# Patient Record
Sex: Male | Born: 1935 | Race: White | Hispanic: No | Marital: Married | Smoking: Former smoker
Health system: Southern US, Community
[De-identification: ages and names within clinical notes are randomized; demographics above are authoritative.]

## PROBLEM LIST (undated history)

## (undated) DIAGNOSIS — I10 Essential (primary) hypertension: Secondary | ICD-10-CM

## (undated) DIAGNOSIS — C61 Malignant neoplasm of prostate: Secondary | ICD-10-CM

## (undated) DIAGNOSIS — I509 Heart failure, unspecified: Secondary | ICD-10-CM

## (undated) DIAGNOSIS — C4490 Unspecified malignant neoplasm of skin, unspecified: Secondary | ICD-10-CM

## (undated) DIAGNOSIS — E119 Type 2 diabetes mellitus without complications: Secondary | ICD-10-CM

## (undated) HISTORY — DX: Malignant neoplasm of prostate: C61

## (undated) HISTORY — PX: PROSTATECTOMY: SHX69

## (undated) HISTORY — DX: Unspecified malignant neoplasm of skin, unspecified: C44.90

---

## 2014-12-07 DIAGNOSIS — Z95810 Presence of automatic (implantable) cardiac defibrillator: Secondary | ICD-10-CM | POA: Insufficient documentation

## 2014-12-07 DIAGNOSIS — I420 Dilated cardiomyopathy: Secondary | ICD-10-CM | POA: Insufficient documentation

## 2014-12-07 DIAGNOSIS — E782 Mixed hyperlipidemia: Secondary | ICD-10-CM | POA: Insufficient documentation

## 2014-12-13 DIAGNOSIS — E119 Type 2 diabetes mellitus without complications: Secondary | ICD-10-CM | POA: Insufficient documentation

## 2014-12-13 DIAGNOSIS — H3530 Unspecified macular degeneration: Secondary | ICD-10-CM | POA: Insufficient documentation

## 2015-04-12 ENCOUNTER — Other Ambulatory Visit: Payer: Self-pay | Admitting: Internal Medicine

## 2015-04-12 DIAGNOSIS — N183 Chronic kidney disease, stage 3 unspecified: Secondary | ICD-10-CM

## 2015-04-12 DIAGNOSIS — E1122 Type 2 diabetes mellitus with diabetic chronic kidney disease: Secondary | ICD-10-CM

## 2015-04-12 DIAGNOSIS — D6489 Other specified anemias: Secondary | ICD-10-CM

## 2015-04-18 ENCOUNTER — Ambulatory Visit
Admission: RE | Admit: 2015-04-18 | Discharge: 2015-04-18 | Disposition: A | Discharge status: Home / Self Care | Payer: Medicare Other | Source: Ambulatory Visit | Attending: Internal Medicine | Admitting: Internal Medicine

## 2015-04-18 DIAGNOSIS — E1122 Type 2 diabetes mellitus with diabetic chronic kidney disease: Secondary | ICD-10-CM | POA: Diagnosis present

## 2015-04-18 DIAGNOSIS — N183 Chronic kidney disease, stage 3 unspecified: Secondary | ICD-10-CM

## 2015-04-18 DIAGNOSIS — K8020 Calculus of gallbladder without cholecystitis without obstruction: Secondary | ICD-10-CM | POA: Diagnosis not present

## 2015-04-18 DIAGNOSIS — D6489 Other specified anemias: Secondary | ICD-10-CM | POA: Diagnosis present

## 2015-04-29 DIAGNOSIS — I10 Essential (primary) hypertension: Secondary | ICD-10-CM | POA: Insufficient documentation

## 2015-04-29 DIAGNOSIS — N183 Chronic kidney disease, stage 3 unspecified: Secondary | ICD-10-CM | POA: Insufficient documentation

## 2015-04-29 DIAGNOSIS — I700 Atherosclerosis of aorta: Secondary | ICD-10-CM | POA: Insufficient documentation

## 2016-10-15 ENCOUNTER — Ambulatory Visit
Admission: RE | Admit: 2016-10-15 | Discharge: 2016-10-15 | Disposition: A | Discharge status: Home / Self Care | Payer: Medicare Other | Source: Ambulatory Visit | Attending: Cardiology | Admitting: Cardiology

## 2016-10-15 ENCOUNTER — Other Ambulatory Visit: Payer: Self-pay | Admitting: Cardiology

## 2016-10-15 DIAGNOSIS — G3281 Cerebellar ataxia in diseases classified elsewhere: Secondary | ICD-10-CM | POA: Diagnosis present

## 2016-10-15 DIAGNOSIS — I639 Cerebral infarction, unspecified: Secondary | ICD-10-CM | POA: Diagnosis not present

## 2016-10-15 DIAGNOSIS — G319 Degenerative disease of nervous system, unspecified: Secondary | ICD-10-CM | POA: Diagnosis not present

## 2016-10-15 DIAGNOSIS — R9082 White matter disease, unspecified: Secondary | ICD-10-CM | POA: Diagnosis not present

## 2016-10-15 DIAGNOSIS — G9389 Other specified disorders of brain: Secondary | ICD-10-CM | POA: Diagnosis not present

## 2016-12-07 ENCOUNTER — Encounter: Payer: Self-pay | Admitting: Intensive Care

## 2016-12-07 ENCOUNTER — Emergency Department: Payer: Medicare Other

## 2016-12-07 ENCOUNTER — Inpatient Hospital Stay
Admission: EM | Admit: 2016-12-07 | Discharge: 2016-12-11 | DRG: 682 | Disposition: A | Discharge status: Skilled Nursing Facility | Payer: Medicare Other | Attending: Internal Medicine | Admitting: Internal Medicine

## 2016-12-07 DIAGNOSIS — Z7982 Long term (current) use of aspirin: Secondary | ICD-10-CM | POA: Diagnosis not present

## 2016-12-07 DIAGNOSIS — D631 Anemia in chronic kidney disease: Secondary | ICD-10-CM | POA: Diagnosis present

## 2016-12-07 DIAGNOSIS — Z95810 Presence of automatic (implantable) cardiac defibrillator: Secondary | ICD-10-CM | POA: Diagnosis not present

## 2016-12-07 DIAGNOSIS — Z87891 Personal history of nicotine dependence: Secondary | ICD-10-CM

## 2016-12-07 DIAGNOSIS — Z23 Encounter for immunization: Secondary | ICD-10-CM

## 2016-12-07 DIAGNOSIS — N179 Acute kidney failure, unspecified: Principal | ICD-10-CM

## 2016-12-07 DIAGNOSIS — E43 Unspecified severe protein-calorie malnutrition: Secondary | ICD-10-CM | POA: Diagnosis present

## 2016-12-07 DIAGNOSIS — Z79899 Other long term (current) drug therapy: Secondary | ICD-10-CM

## 2016-12-07 DIAGNOSIS — R1012 Left upper quadrant pain: Secondary | ICD-10-CM | POA: Diagnosis present

## 2016-12-07 DIAGNOSIS — Z8546 Personal history of malignant neoplasm of prostate: Secondary | ICD-10-CM

## 2016-12-07 DIAGNOSIS — Z7984 Long term (current) use of oral hypoglycemic drugs: Secondary | ICD-10-CM

## 2016-12-07 DIAGNOSIS — Z681 Body mass index (BMI) 19 or less, adult: Secondary | ICD-10-CM

## 2016-12-07 DIAGNOSIS — I130 Hypertensive heart and chronic kidney disease with heart failure and stage 1 through stage 4 chronic kidney disease, or unspecified chronic kidney disease: Secondary | ICD-10-CM | POA: Diagnosis present

## 2016-12-07 DIAGNOSIS — I509 Heart failure, unspecified: Secondary | ICD-10-CM | POA: Diagnosis present

## 2016-12-07 DIAGNOSIS — E860 Dehydration: Secondary | ICD-10-CM | POA: Diagnosis present

## 2016-12-07 DIAGNOSIS — E1122 Type 2 diabetes mellitus with diabetic chronic kidney disease: Secondary | ICD-10-CM | POA: Diagnosis present

## 2016-12-07 DIAGNOSIS — N183 Chronic kidney disease, stage 3 (moderate): Secondary | ICD-10-CM | POA: Diagnosis present

## 2016-12-07 HISTORY — DX: Type 2 diabetes mellitus without complications: E11.9

## 2016-12-07 HISTORY — DX: Essential (primary) hypertension: I10

## 2016-12-07 HISTORY — DX: Heart failure, unspecified: I50.9

## 2016-12-07 LAB — HEPATIC FUNCTION PANEL
ALBUMIN: 3.4 g/dL — AB (ref 3.5–5.0)
ALK PHOS: 23 U/L — AB (ref 38–126)
ALT: 21 U/L (ref 17–63)
AST: 34 U/L (ref 15–41)
BILIRUBIN INDIRECT: 0.7 mg/dL (ref 0.3–0.9)
BILIRUBIN TOTAL: 0.8 mg/dL (ref 0.3–1.2)
Bilirubin, Direct: 0.1 mg/dL (ref 0.1–0.5)
TOTAL PROTEIN: 6.8 g/dL (ref 6.5–8.1)

## 2016-12-07 LAB — BASIC METABOLIC PANEL
Anion gap: 12 (ref 5–15)
BUN: 68 mg/dL — AB (ref 6–20)
CALCIUM: 8.6 mg/dL — AB (ref 8.9–10.3)
CO2: 21 mmol/L — AB (ref 22–32)
Chloride: 100 mmol/L — ABNORMAL LOW (ref 101–111)
Creatinine, Ser: 3.35 mg/dL — ABNORMAL HIGH (ref 0.61–1.24)
GFR calc Af Amer: 18 mL/min — ABNORMAL LOW (ref >60)
GFR, EST NON AFRICAN AMERICAN: 15 mL/min — AB (ref >60)
GLUCOSE: 105 mg/dL — AB (ref 65–99)
Potassium: 4.3 mmol/L (ref 3.5–5.1)
Sodium: 133 mmol/L — ABNORMAL LOW (ref 135–145)

## 2016-12-07 LAB — CBC
HCT: 24.6 % — ABNORMAL LOW (ref 40.0–52.0)
Hemoglobin: 8.3 g/dL — ABNORMAL LOW (ref 13.0–18.0)
MCH: 29.5 pg (ref 26.0–34.0)
MCHC: 33.9 g/dL (ref 32.0–36.0)
MCV: 86.9 fL (ref 80.0–100.0)
Platelets: 507 10*3/uL — ABNORMAL HIGH (ref 150–440)
RBC: 2.83 MIL/uL — ABNORMAL LOW (ref 4.40–5.90)
RDW: 19.5 % — AB (ref 11.5–14.5)
WBC: 7.4 10*3/uL (ref 3.8–10.6)

## 2016-12-07 LAB — IRON AND TIBC
Iron: 50 ug/dL (ref 45–182)
SATURATION RATIOS: 17 % — AB (ref 17.9–39.5)
TIBC: 298 ug/dL (ref 250–450)
UIBC: 248 ug/dL

## 2016-12-07 LAB — FOLATE: FOLATE: 21 ng/mL (ref >5.9)

## 2016-12-07 LAB — VITAMIN B12: Vitamin B-12: 1198 pg/mL — ABNORMAL HIGH (ref 180–914)

## 2016-12-07 LAB — RETICULOCYTES
RBC.: 2.65 MIL/uL — AB (ref 4.40–5.90)
RETIC COUNT ABSOLUTE: 34.5 10*3/uL (ref 19.0–183.0)
Retic Ct Pct: 1.3 % (ref 0.4–3.1)

## 2016-12-07 LAB — TSH: TSH: 1.991 u[IU]/mL (ref 0.350–4.500)

## 2016-12-07 LAB — FERRITIN: FERRITIN: 321 ng/mL (ref 24–336)

## 2016-12-07 LAB — LIPASE, BLOOD: LIPASE: 94 U/L — AB (ref 11–51)

## 2016-12-07 MED ORDER — INFLUENZA VAC SPLIT HIGH-DOSE 0.5 ML IM SUSY
0.5000 mL | PREFILLED_SYRINGE | INTRAMUSCULAR | Status: AC
  Administered 2016-12-11: 0.5 mL via INTRAMUSCULAR
  Filled 2016-12-07 (×2): qty 0.5

## 2016-12-07 MED ORDER — HEPARIN SODIUM (PORCINE) 5000 UNIT/ML IJ SOLN
5000.0000 [IU] | Freq: Three times a day (TID) | INTRAMUSCULAR | Status: DC
  Administered 2016-12-07 – 2016-12-11 (×11): 5000 [IU] via SUBCUTANEOUS
  Filled 2016-12-07 (×12): qty 1

## 2016-12-07 MED ORDER — ACETAMINOPHEN 650 MG RE SUPP: 650.0000 mg | Freq: Four times a day (QID) | RECTAL | Status: DC | PRN

## 2016-12-07 MED ORDER — CARVEDILOL 6.25 MG PO TABS
6.2500 mg | ORAL_TABLET | Freq: Two times a day (BID) | ORAL | Status: DC
  Administered 2016-12-08 – 2016-12-11 (×7): 6.25 mg via ORAL
  Filled 2016-12-07 (×7): qty 1

## 2016-12-07 MED ORDER — ONDANSETRON HCL 4 MG PO TABS: 4.0000 mg | ORAL_TABLET | Freq: Four times a day (QID) | ORAL | Status: DC | PRN

## 2016-12-07 MED ORDER — ACETAMINOPHEN 325 MG PO TABS
650.0000 mg | ORAL_TABLET | Freq: Four times a day (QID) | ORAL | Status: DC | PRN
  Administered 2016-12-09 – 2016-12-11 (×3): 650 mg via ORAL
  Filled 2016-12-07 (×3): qty 2

## 2016-12-07 MED ORDER — SODIUM CHLORIDE 0.9 % IV SOLN
INTRAVENOUS | Status: DC
  Administered 2016-12-07 – 2016-12-11 (×6): via INTRAVENOUS

## 2016-12-07 MED ORDER — ONDANSETRON HCL 4 MG/2ML IJ SOLN
4.0000 mg | Freq: Four times a day (QID) | INTRAMUSCULAR | Status: DC | PRN
  Administered 2016-12-09: 4 mg via INTRAVENOUS
  Filled 2016-12-07: qty 2

## 2016-12-07 MED ORDER — SODIUM CHLORIDE 0.9 % IV BOLUS (SEPSIS)
1000.0000 mL | Freq: Once | INTRAVENOUS | Status: AC
  Administered 2016-12-07: 1000 mL via INTRAVENOUS

## 2016-12-07 MED ORDER — DOCUSATE SODIUM 100 MG PO CAPS
200.0000 mg | ORAL_CAPSULE | Freq: Two times a day (BID) | ORAL | Status: DC
  Administered 2016-12-07 – 2016-12-11 (×7): 200 mg via ORAL
  Filled 2016-12-07 (×8): qty 2

## 2016-12-07 MED ORDER — SENNA 8.6 MG PO TABS
1.0000 | ORAL_TABLET | Freq: Every day | ORAL | Status: DC
  Administered 2016-12-08 – 2016-12-11 (×4): 8.6 mg via ORAL
  Filled 2016-12-07 (×4): qty 1

## 2016-12-07 MED ORDER — ASPIRIN EC 325 MG PO TBEC
325.0000 mg | DELAYED_RELEASE_TABLET | Freq: Every day | ORAL | Status: DC
  Administered 2016-12-08 – 2016-12-11 (×4): 325 mg via ORAL
  Filled 2016-12-07 (×5): qty 1

## 2016-12-07 NOTE — ED Notes (Signed)
Pt returned from [place], admitting MD at bedside.

## 2016-12-07 NOTE — ED Notes (Signed)
Pt wife wants to make sure she can be reached if needed. Will be staying at [facility]. Can be reached at [phone]. If that does not work, [phone]

## 2016-12-07 NOTE — ED Notes (Signed)
Pt taken to CT at this time.

## 2016-12-07 NOTE — H&P (Signed)
[facility] [place] NAME: Aaron Barnett    MR#:  [id number]  DATE OF BIRTH:  [birth year]  DATE OF ADMISSION:  12/07/2016  PRIMARY CARE PHYSICIAN: Tracie Harrier, MD   REQUESTING/REFERRING PHYSICIAN: Delman Kitten MD  CHIEF COMPLAINT:  No chief complaint on file.   HISTORY OF PRESENT ILLNESS: Aaron Barnett  is a 83 y.o. male with a known history of congestive heart failure, diabetes type 2, essential hypertension who was sent by his primary care 87 office for anemia as well as acute renal failure. Looking back at his records patient's hemoglobin usually ever is around 10.5 or so. Also has chronic renal failure with creatinine of around 1.7. Who had blood work checked on Suzanne's noted to have hemoglobins in the sevens. And creatinines in the 3. Patient  States that he's been having some pain in the left. He also reports feeling very weak and has lack of appetite. He's lost some weight but he is not sure how much.     PAST MEDICAL HISTORY:   Past Medical History:  Diagnosis Date  . CHF (congestive heart failure) ([place])   . Diabetes mellitus without complication ([place])   . HTN (hypertension)     PAST SURGICAL HISTORY: Past Surgical History:  Procedure Laterality Date  . PROSTATECTOMY      SOCIAL HISTORY:  Social History  Substance Use Topics  . Smoking status: Former Smoker    Types: Cigarettes  . Smokeless tobacco: Never Used  . Alcohol use Yes    FAMILY HISTORY:  Family History  Problem Relation Age of Onset  . Hypertension Mother     DRUG ALLERGIES:  Allergies  Allergen Reactions  . Penicillins Rash    r    REVIEW OF SYSTEMS:   CONSTITUTIONAL: No fever,positive fatigue or positive weakness.  EYES: No blurred or double vision.  EARS, NOSE, AND THROAT: No tinnitus or ear pain.  RESPIRATORY: No cough, shortness of breath, wheezing or hemoptysis.  CARDIOVASCULAR: No chest pain, orthopnea, edema.  GASTROINTESTINAL: No  nausea, vomiting, diarrhea or abdominal pain.  GENITOURINARY: No dysuria, hematuria.  ENDOCRINE: No polyuria, nocturia,  HEMATOLOGY: chronic anemia, easy bruising or bleeding SKIN: No rash or lesion. MUSCULOSKELETAL: No joint pain or arthritis.   NEUROLOGIC: No tingling, numbness, weakness.  PSYCHIATRY: No anxiety or depression.   MEDICATIONS AT HOME:  Prior to Admission medications   Medication Sig Start Date End Date Taking? Authorizing Provider  amLODipine (NORVASC) 2.5 MG tablet Take 2.5 mg by mouth daily.   Yes [provider]  aspirin 325 MG tablet Take 325 mg by mouth daily.   Yes [provider]  carvedilol (COREG) 6.25 MG tablet Take 6.25 mg by mouth 2 (two) times daily with a meal.   Yes [provider]  fenofibrate (TRICOR) 145 MG tablet Take 145 mg by mouth daily.   Yes [provider]      PHYSICAL EXAMINATION:   VITAL SIGNS: Blood pressure (!) 169/84, pulse 60, temperature 97.8 F (36.6 C), temperature source Oral, resp. rate 16, height 5\' 10"  (1.778 m), weight 135 lb (61.2 kg), SpO2 100 %.  GENERAL:  83 y.o.-year-old patient lying in the bed with no acute distress.  EYES: Pupils equal, round, reactive to light and accommodation. No scleral icterus. Extraocular muscles intact.  HEENT: Head atraumatic, normocephalic. Oropharynx and nasopharynx clear.  NECK:  Supple, no jugular venous distention. No thyroid enlargement, no tenderness.  LUNGS: Normal breath sounds bilaterally, no  wheezing, rales,rhonchi or crepitation. No use of accessory muscles of respiration.  CARDIOVASCULAR: S1, S2 normal. No murmurs, rubs, or gallops.  ABDOMEN: Soft, nontender, nondistended. Bowel sounds present. No organomegaly or mass.  EXTREMITIES: No pedal edema, cyanosis, or clubbing.  NEUROLOGIC: Cranial nerves II through XII are intact. Muscle strength 5/5 in all extremities. Sensation intact. Gait not checked.  PSYCHIATRIC: The patient is alert and oriented  x 3.  SKIN: No obvious rash, lesion, or ulcer.   LABORATORY PANEL:   CBC  Recent Labs Lab 12/07/16 1233  WBC 7.4  HGB 8.3*  HCT 24.6*  PLT 507*  MCV 86.9  MCH 29.5  MCHC 33.9  RDW 19.5*   ------------------------------------------------------------------------------------------------------------------  Chemistries   Recent Labs Lab 12/07/16 1233  NA 133*  K 4.3  CL 100*  CO2 21*  GLUCOSE 105*  BUN 68*  CREATININE 3.35*  CALCIUM 8.6*   ------------------------------------------------------------------------------------------------------------------ estimated creatinine clearance is 13.4 mL/min (A) (by C-G formula based on SCr of 3.35 mg/dL (H)). ------------------------------------------------------------------------------------------------------------------ No results for input(s): TSH, T4TOTAL, T3FREE, THYROIDAB in the last 72 hours.  Invalid input(s): FREET3   Coagulation profile No results for input(s): INR, PROTIME in the last 168 hours. ------------------------------------------------------------------------------------------------------------------- No results for input(s): DDIMER in the last 72 hours. -------------------------------------------------------------------------------------------------------------------  Cardiac Enzymes No results for input(s): CKMB, TROPONINI, MYOGLOBIN in the last 168 hours.  Invalid input(s): CK ------------------------------------------------------------------------------------------------------------------ Invalid input(s): POCBNP  ---------------------------------------------------------------------------------------------------------------  Urinalysis No results found for: COLORURINE, APPEARANCEUR, LABSPEC, [place], GLUCOSEU, HGBUR, BILIRUBINUR, KETONESUR, PROTEINUR, UROBILINOGEN, NITRITE, LEUKOCYTESUR   RADIOLOGY: Ct Renal Stone Study  Result Date: 12/07/2016 CLINICAL DATA:  83 year old male with weakness,  generalized pain, anemia, renal failure. EXAM: CT ABDOMEN AND PELVIS WITHOUT CONTRAST TECHNIQUE: Multidetector CT imaging of the abdomen and pelvis was performed following the standard protocol without IV contrast. COMPARISON:  04/18/2015 CT Abdomen and Pelvis FINDINGS: Lower chest: Partially visible cardiac pacemaker or AICD leads. No pericardial effusion. Mild cardiomegaly. Negative lung bases. No pleural effusion. Hepatobiliary: Small layering cholelithiasis. No pericholecystic inflammation. Negative noncontrast liver ; small chronic calcified granulomas in the left lobe. Pancreas: Within normal limits. Spleen: Negative. Adrenals/Urinary Tract: Normal adrenal glands. Bilateral noncontrast kidneys appears stable and normal for age. No hydroureter. Negative course of both ureters. Small right hemipelvis phleboliths are stable. Unremarkable urinary bladder. Stomach/Bowel: Negative rectum. Redundant sigmoid colon with retained stool but otherwise negative. Retained stool in the left colon. Minimal diverticulae, no active inflammation. Continued retained stool throughout the transverse colon which is otherwise negative. Redundant hepatic flexure and right colon which is mostly decompressed. Elongated but normal appendix. Negative terminal ileum. No dilated small bowel. Negative stomach. The second portion of the duodenum is fluid-filled, but not definitely inflamed or abnormal (series 2, image 28). The remaining duodenum is negative. No abdominal free fluid or free air. Vascular/Lymphatic: Extensive Aortoiliac calcified atherosclerosis. Vascular patency is not evaluated in the absence of IV contrast. No lymphadenopathy. Reproductive: Stable small left fat containing inguinal hernia. Other: No pelvic free fluid. Musculoskeletal: Osteopenia. Diffuse lower thoracic and lumbar spinal compression fractures sparing the L3 level. Prior proximal left femur ORIF. Stable visualized osseous structures. IMPRESSION: 1. No  convincing acute or inflammatory process in the noncontrast abdomen or pelvis. 2.  Aortic Atherosclerosis (ICD10-I70.0). 3. Chronic cholelithiasis. 4. Chronic spinal compression fractures. Electronically Signed   By: Genevie Ann M.D.   On: 12/07/2016 17:56    EKG: Orders placed or performed during the hospital encounter of 12/07/16  . ED EKG  . ED EKG  IMPRESSION AND PLAN: Patient is a 83 year old sent from primary care's office for worsening anemia and renal failure 1. Acute renal failure on chronic kidney disease patient is currently on Lasix which I will stop I will give him  IV fluids monitor renal function Check SPEP Check a bladder scan  2. Anemia which was 7.4 in the primary care's office this morning however here is 8.3 I will check iron panel Guaiac stools Patient many GI evaluation If hemoglobin drops further will need transfusion  3. Diabetes type 2 I will place patient on sliding scale insulin discontinue metformin  4. History of congestive heart failure I will stop his Lasix  5. Abdominal pain could be related to constipation Also has significant drastic and lumbar fractures could be causing the symptoms If symptoms persists consider further evaluation of his spine  6. Misc: lovenox     All the records are reviewed and case discussed with ED provider. Management plans discussed with the patient, family and they are in agreement.  CODE STATUS: Code Status History    This patient does not have a recorded code status. Please follow your organizational policy for patients in this situation.    Advance Directive Documentation     Most Recent Value  Type of Advance Directive  Healthcare Power of Attorney, Living will  Pre-existing out of facility DNR order (yellow form or pink MOST form)  -  "MOST" Form in Place?  -       TOTAL TIME TAKING CARE OF THIS PATIENT: 52minutes.    Dustin Flock M.D on 12/07/2016 at 6:11 PM  Between 7am to 6pm - Pager -  [phone]  After 6pm go to [email or web address] - password [email or web address] [facility]  Office  [phone]  CC: Primary care physician; Tracie Harrier, MD

## 2016-12-07 NOTE — ED Provider Notes (Signed)
[facility] Emergency Department Provider Note   ____________________________________________   First MD Initiated Contact with Patient 12/07/16 1709     (approximate)  I have reviewed the triage vital signs and the nursing notes.   HISTORY  Chief Complaint increasing weakness   HPI Aaron Barnett is a 83 y.o. male therefore evaluation of increasing fatigue, referred by his primary doctor for concerns of worsening kidney function  Patient reports for the last several weeks to months she's been experiencing intermittent discomfort in his left abdomen, he has not been eating well, frequently nauseated, has been seeing his primary doctor and it noted his blood counts of than slightly low and also concerned about worsening renal function. The patient speaks with clear knowledge of medical issues noting his creatinine is elevated to over 3, his BUN is been elevated, his hemoglobin has been 7.4  Currently denies being in any pain or having any nausea. Feels weak and as though he is becoming "dehydrated".  no chest pain or trouble breathing. Denies black or bloody stools  Past Medical History:  Diagnosis Date  . CHF (congestive heart failure) ([place])   . Diabetes mellitus without complication ([place])   . HTN (hypertension)     Patient Active Problem List   Diagnosis Date Noted  . ARF (acute renal failure) ([place]) 12/07/2016    Past Surgical History:  Procedure Laterality Date  . PROSTATECTOMY      Prior to Admission medications   Medication Sig Start Date End Date Taking? Authorizing Provider  amLODipine (NORVASC) 2.5 MG tablet Take 2.5 mg by mouth daily.   Yes [provider]  aspirin 325 MG tablet Take 325 mg by mouth daily.   Yes [provider]  Biotin 10 MG TABS Take 1 tablet by mouth daily.   Yes [provider]  carvedilol (COREG) 6.25 MG tablet Take 6.25 mg by mouth 2 (two) times daily with a meal.   Yes [provider]  carvedilol (COREG) 6.25 MG tablet Take 1 tablet by mouth 2 (two) times daily. 07/27/16  Yes [provider]  fenofibrate (TRICOR) 145 MG tablet Take 145 mg by mouth daily.   Yes [provider]  metFORMIN (GLUCOPHAGE-XR) 500 MG 24 hr tablet Take 1 tablet by mouth daily. 08/13/16  Yes [provider]  Multiple Vitamin (MULTIVITAMIN) capsule Take 1 capsule by mouth daily.   Yes [provider]  nitroGLYCERIN (NITROSTAT) 0.4 MG SL tablet Place 1 tablet under the tongue every 5 (five) minutes as needed. 06/12/16  Yes [provider]  vitamin E 400 UNIT capsule Take 1 capsule by mouth daily.   Yes [provider]  hydrocortisone 2.5 % cream Apply 1 application topically daily as needed. 09/04/16   [provider]    Allergies Doxycycline; Lisinopril; and Penicillins  Family History  Problem Relation Age of Onset  . Hypertension Mother     Social History Social History  Substance Use Topics  . Smoking status: Former Smoker    Types: Cigarettes  . Smokeless tobacco: Never Used  . Alcohol use Yes    Review of Systems Constitutional: No fever/chills Eyes: No visual changes. ENT: No sore throat. Cardiovascular: Denies chest pain. Respiratory: Denies shortness of breath. Gastrointestinal: No abdominal painpresently but often will experience pain in the left mid to left upper abdomen after eating for several weeks now.  No nausea, no vomiting.  No diarrhea.  No constipation. Genitourinary: Negative for dysuria. Musculoskeletal: Negative for back  pain. Skin: Negative for rash. Neurological: Negative for headaches, focal weakness or numbness.    ____________________________________________   PHYSICAL EXAM:  VITAL SIGNS: ED Triage Vitals  Enc Vitals Group     BP 12/07/16 1217 (!) 169/84     Pulse Rate 12/07/16 1217 60     Resp 12/07/16 1217 16     Temp 12/07/16 1217 97.8 F (36.6 C)     Temp Source 12/07/16 1217 Oral      SpO2 12/07/16 1217 100 %     Weight 12/07/16 1218 135 lb (61.2 kg)     Height 12/07/16 1218 5\' 10"  (1.778 m)     Head Circumference --      Peak Flow --      Pain Score --      Pain Loc --      Pain Edu? --      Excl. in [place]? --     Constitutional: Alert and oriented. Well appearing and in no acute distress. Eyes: Conjunctivae are normal.eyes appear slightly sunken. Head: Atraumatic. Nose: No congestion/rhinnorhea. Mouth/Throat: Mucous membranes are slightly dry. Neck: No stridor.   Cardiovascular: Normal rate, regular rhythm. Grossly normal heart sounds.  Good peripheral circulation. Respiratory: Normal respiratory effort.  No retractions. Lungs CTAB. Gastrointestinal: Soft and nontender. No distention.no lesions noted across the skin of the back. Musculoskeletal: No lower extremity tenderness nor edema. Neurologic:  Normal speech and language. No gross focal neurologic deficits are appreciated.  Skin:  Skin is warm, dry and intact. No rash noted. Psychiatric: Mood and affect are normal. Speech and behavior are normal.  ____________________________________________   LABS (all labs ordered are listed, but only abnormal results are displayed)  Labs Reviewed  BASIC METABOLIC PANEL - Abnormal; Notable for the following:       Result Value   Sodium 133 (*)    Chloride 100 (*)    CO2 21 (*)    Glucose, Bld 105 (*)    BUN 68 (*)    Creatinine, Ser 3.35 (*)    Calcium 8.6 (*)    GFR calc non Af Amer 15 (*)    GFR calc Af Amer 18 (*)    All other components within normal limits  CBC - Abnormal; Notable for the following:    RBC 2.83 (*)    Hemoglobin 8.3 (*)    HCT 24.6 (*)    RDW 19.5 (*)    Platelets 507 (*)    All other components within normal limits  HEPATIC FUNCTION PANEL - Abnormal; Notable for the following:    Albumin 3.4 (*)    Alkaline Phosphatase 23 (*)    All other components within normal limits  LIPASE, BLOOD - Abnormal; Notable for the following:     Lipase 94 (*)    All other components within normal limits  VITAMIN B12 - Abnormal; Notable for the following:    Vitamin B-12 1,198 (*)    All other components within normal limits  IRON AND TIBC - Abnormal; Notable for the following:    Saturation Ratios 17 (*)    All other components within normal limits  RETICULOCYTES - Abnormal; Notable for the following:    RBC. 2.65 (*)    All other components within normal limits  FOLATE  FERRITIN  TSH  URINALYSIS, COMPLETE (UACMP) WITH MICROSCOPIC  PROTEIN ELECTROPHORESIS, SERUM  URINALYSIS, COMPLETE (UACMP) WITH MICROSCOPIC  BASIC METABOLIC PANEL  CBC   ____________________________________________  EKG  reviewed and interpreted by me in  1930 Heart rate 60 QRS 116 QTc 480 AV dual paced, no evidence of ischemic changes noted ____________________________________________  RADIOLOGY  Ct Renal Stone Study  Result Date: 12/07/2016 CLINICAL DATA:  83 year old male with weakness, generalized pain, anemia, renal failure. EXAM: CT ABDOMEN AND PELVIS WITHOUT CONTRAST TECHNIQUE: Multidetector CT imaging of the abdomen and pelvis was performed following the standard protocol without IV contrast. COMPARISON:  04/18/2015 CT Abdomen and Pelvis FINDINGS: Lower chest: Partially visible cardiac pacemaker or AICD leads. No pericardial effusion. Mild cardiomegaly. Negative lung bases. No pleural effusion. Hepatobiliary: Small layering cholelithiasis. No pericholecystic inflammation. Negative noncontrast liver ; small chronic calcified granulomas in the left lobe. Pancreas: Within normal limits. Spleen: Negative. Adrenals/Urinary Tract: Normal adrenal glands. Bilateral noncontrast kidneys appears stable and normal for age. No hydroureter. Negative course of both ureters. Small right hemipelvis phleboliths are stable. Unremarkable urinary bladder. Stomach/Bowel: Negative rectum. Redundant sigmoid colon with retained stool but otherwise negative. Retained stool  in the left colon. Minimal diverticulae, no active inflammation. Continued retained stool throughout the transverse colon which is otherwise negative. Redundant hepatic flexure and right colon which is mostly decompressed. Elongated but normal appendix. Negative terminal ileum. No dilated small bowel. Negative stomach. The second portion of the duodenum is fluid-filled, but not definitely inflamed or abnormal (series 2, image 28). The remaining duodenum is negative. No abdominal free fluid or free air. Vascular/Lymphatic: Extensive Aortoiliac calcified atherosclerosis. Vascular patency is not evaluated in the absence of IV contrast. No lymphadenopathy. Reproductive: Stable small left fat containing inguinal hernia. Other: No pelvic free fluid. Musculoskeletal: Osteopenia. Diffuse lower thoracic and lumbar spinal compression fractures sparing the L3 level. Prior proximal left femur ORIF. Stable visualized osseous structures. IMPRESSION: 1. No convincing acute or inflammatory process in the noncontrast abdomen or pelvis. 2.  Aortic Atherosclerosis (ICD10-I70.0). 3. Chronic cholelithiasis. 4. Chronic spinal compression fractures. Electronically Signed   By: Genevie Ann M.D.   On: 12/07/2016 17:56    CT abdomen and pelvis reviewed, no acute findings ____________________________________________   PROCEDURES  Procedure(s) performed: None  Procedures  Critical Care performed: No  ____________________________________________   INITIAL IMPRESSION / ASSESSMENT AND PLAN / ED COURSE  Pertinent labs & imaging results that were available during my care of the patient were reviewed by me and considered in my medical decision making (see chart for details).  increasing creatinine. Denies urinary symptoms or feeling of bladder fullness. Suspect likely prerenal in nature, though given the associated flank discomfort is experience also possible renal etiology, exclude kidney stone, obstruction, etc. We'll start with  hydration, given the patient's significant the elevated creatinine versus several months ago and some acuity of his condition suspect this represents acute kidney injury. We will obtain a CT to further evaluate for intra-abdominal etiology, also given associated symptoms low hemoglobin peptic ulcer cannot be excluded at this time. He does however deny any black or bloody stools.          ____________________________________________   FINAL CLINICAL IMPRESSION(S) / ED DIAGNOSES  Final diagnoses:  AKI (acute kidney injury) ([place])  Left upper quadrant abdominal pain of unknown etiology      NEW MEDICATIONS STARTED DURING THIS VISIT:  Current Discharge Medication List       Note:  This document was prepared using Dragon voice recognition software and may include unintentional dictation errors.     Delman Kitten, MD 12/08/16 [phone]

## 2016-12-07 NOTE — ED Notes (Signed)
Pt transport to 205

## 2016-12-07 NOTE — ED Triage Notes (Addendum)
Patient had appointment today at [facility] for weakness and feeling tired for several weeks. Reports trouble with balance from his weak legs. Denies pain at this time. Jefm Bryant drew labs and had results of HGB 7.4 and renal failure. A&O x4.

## 2016-12-08 LAB — BASIC METABOLIC PANEL
Anion gap: 8 (ref 5–15)
BUN: 63 mg/dL — AB (ref 6–20)
CHLORIDE: 108 mmol/L (ref 101–111)
CO2: 21 mmol/L — AB (ref 22–32)
CREATININE: 2.86 mg/dL — AB (ref 0.61–1.24)
Calcium: 7.7 mg/dL — ABNORMAL LOW (ref 8.9–10.3)
GFR calc Af Amer: 21 mL/min — ABNORMAL LOW (ref >60)
GFR calc non Af Amer: 18 mL/min — ABNORMAL LOW (ref >60)
Glucose, Bld: 77 mg/dL (ref 65–99)
Potassium: 4 mmol/L (ref 3.5–5.1)
Sodium: 137 mmol/L (ref 135–145)

## 2016-12-08 LAB — GLUCOSE, CAPILLARY
Glucose-Capillary: 118 mg/dL — ABNORMAL HIGH (ref 65–99)
Glucose-Capillary: 106 mg/dL — ABNORMAL HIGH (ref 65–99)
Glucose-Capillary: 120 mg/dL — ABNORMAL HIGH (ref 65–99)

## 2016-12-08 LAB — CBC
HCT: 21.9 % — ABNORMAL LOW (ref 40.0–52.0)
Hemoglobin: 7.4 g/dL — ABNORMAL LOW (ref 13.0–18.0)
MCH: 30.4 pg (ref 26.0–34.0)
MCHC: 33.8 g/dL (ref 32.0–36.0)
MCV: 89.7 fL (ref 80.0–100.0)
PLATELETS: 436 10*3/uL (ref 150–440)
RBC: 2.44 MIL/uL — ABNORMAL LOW (ref 4.40–5.90)
RDW: 19.3 % — ABNORMAL HIGH (ref 11.5–14.5)
WBC: 6.7 10*3/uL (ref 3.8–10.6)

## 2016-12-08 LAB — URINALYSIS, COMPLETE (UACMP) WITH MICROSCOPIC
BILIRUBIN URINE: NEGATIVE
Bacteria, UA: NONE SEEN
GLUCOSE, UA: NEGATIVE mg/dL
HGB URINE DIPSTICK: NEGATIVE
Ketones, ur: NEGATIVE mg/dL
LEUKOCYTES UA: NEGATIVE
NITRITE: NEGATIVE
PH: 6 (ref 5.0–8.0)
Protein, ur: NEGATIVE mg/dL
SPECIFIC GRAVITY, URINE: 1.006 (ref 1.005–1.030)
Squamous Epithelial / LPF: NONE SEEN
WBC, UA: NONE SEEN WBC/hpf (ref 0–5)

## 2016-12-08 LAB — OCCULT BLOOD X 1 CARD TO LAB, STOOL: Fecal Occult Bld: NEGATIVE

## 2016-12-08 MED ORDER — INSULIN ASPART 100 UNIT/ML ~~LOC~~ SOLN
0.0000 [IU] | Freq: Every day | SUBCUTANEOUS | Status: DC
  Filled 2016-12-08: qty 1

## 2016-12-08 MED ORDER — BISACODYL 5 MG PO TBEC
5.0000 mg | DELAYED_RELEASE_TABLET | Freq: Every day | ORAL | Status: DC | PRN
  Administered 2016-12-11: 5 mg via ORAL
  Filled 2016-12-08: qty 1

## 2016-12-08 MED ORDER — AMLODIPINE BESYLATE 5 MG PO TABS: 2.5000 mg | ORAL_TABLET | Freq: Every day | ORAL | Status: DC

## 2016-12-08 MED ORDER — NITROGLYCERIN 0.4 MG SL SUBL: 0.4000 mg | SUBLINGUAL_TABLET | SUBLINGUAL | Status: DC | PRN

## 2016-12-08 MED ORDER — INSULIN ASPART 100 UNIT/ML ~~LOC~~ SOLN
0.0000 [IU] | Freq: Three times a day (TID) | SUBCUTANEOUS | Status: DC
  Filled 2016-12-08: qty 1

## 2016-12-08 NOTE — Progress Notes (Signed)
[facility] [place] NAME: Aaron Barnett    MR#:  [id number]  DATE OF BIRTH:  05-Aug-1927  SUBJECTIVE:  CHIEF COMPLAINT:  No chief complaint on file.  Generalized weakness.Marland Kitchen REVIEW OF SYSTEMS:  Review of Systems  Constitutional: Positive for malaise/fatigue. Negative for chills and fever.  HENT: Negative for sore throat.   Eyes: Negative for blurred vision and double vision.  Respiratory: Negative for cough, hemoptysis, shortness of breath, wheezing and stridor.   Cardiovascular: Negative for chest pain, palpitations, orthopnea and leg swelling.  Gastrointestinal: Negative for abdominal pain, blood in stool, diarrhea, melena, nausea and vomiting.  Genitourinary: Negative for dysuria, flank pain and hematuria.  Musculoskeletal: Negative for back pain and joint pain.  Skin: Negative for rash.  Neurological: Positive for weakness. Negative for dizziness, sensory change, focal weakness, seizures, loss of consciousness and headaches.  Endo/Heme/Allergies: Negative for polydipsia.  Psychiatric/Behavioral: Negative for depression. The patient is not nervous/anxious.     DRUG ALLERGIES:   Allergies  Allergen Reactions  . Doxycycline Rash  . Lisinopril Rash  . Penicillins Rash    Has patient had a PCN reaction causing immediate rash, facial/tongue/throat swelling, SOB or lightheadedness with hypotension: Yes Has patient had a PCN reaction causing severe rash involving mucus membranes or skin necrosis: No Has patient had a PCN reaction that required hospitalization: No Has patient had a PCN reaction occurring within the last 10 years: No If all of the above answers are "NO", then may proceed with Cephalosporin use.   VITALS:  Blood pressure (!) 101/59, pulse 69, temperature (!) 97.3 F (36.3 C), temperature source Oral, resp. rate 20, height 5\' 10"  (1.778 m), weight 137 lb (62.1 kg), SpO2 100 %. PHYSICAL EXAMINATION:  Physical Exam    Constitutional: He is oriented to person, place, and time and well-developed, well-nourished, and in no distress.  HENT:  Head: Normocephalic.  Mouth/Throat: Oropharynx is clear and moist.  Eyes: Pupils are equal, round, and reactive to light. Conjunctivae and EOM are normal. No scleral icterus.  Neck: Normal range of motion. Neck supple. No JVD present. No tracheal deviation present.  Cardiovascular: Normal rate, regular rhythm and normal heart sounds.  Exam reveals no gallop.   No murmur heard. Pulmonary/Chest: Effort normal and breath sounds normal. No respiratory distress. He has no wheezes. He has no rales.  Abdominal: Soft. Bowel sounds are normal. He exhibits no distension. There is no tenderness. There is no rebound.  Musculoskeletal: Normal range of motion. He exhibits no edema or tenderness.  Neurological: He is alert and oriented to person, place, and time. No cranial nerve deficit.  Skin: No rash noted. No erythema.  Psychiatric: Affect normal.   LABORATORY PANEL:  Male CBC  Recent Labs Lab 12/08/16 0358  WBC 6.7  HGB 7.4*  HCT 21.9*  PLT 436   ------------------------------------------------------------------------------------------------------------------ Chemistries   Recent Labs Lab 12/07/16 1233 12/08/16 0358  NA 133* 137  K 4.3 4.0  CL 100* 108  CO2 21* 21*  GLUCOSE 105* 77  BUN 68* 63*  CREATININE 3.35* 2.86*  CALCIUM 8.6* 7.7*  AST 34  --   ALT 21  --   ALKPHOS 23*  --   BILITOT 0.8  --    RADIOLOGY:  Ct Renal Stone Study  Result Date: 12/07/2016 CLINICAL DATA:  83 year old male with weakness, generalized pain, anemia, renal failure. EXAM: CT ABDOMEN AND PELVIS WITHOUT CONTRAST TECHNIQUE: Multidetector CT imaging of the abdomen and pelvis was  performed following the standard protocol without IV contrast. COMPARISON:  04/18/2015 CT Abdomen and Pelvis FINDINGS: Lower chest: Partially visible cardiac pacemaker or AICD leads. No pericardial  effusion. Mild cardiomegaly. Negative lung bases. No pleural effusion. Hepatobiliary: Small layering cholelithiasis. No pericholecystic inflammation. Negative noncontrast liver ; small chronic calcified granulomas in the left lobe. Pancreas: Within normal limits. Spleen: Negative. Adrenals/Urinary Tract: Normal adrenal glands. Bilateral noncontrast kidneys appears stable and normal for age. No hydroureter. Negative course of both ureters. Small right hemipelvis phleboliths are stable. Unremarkable urinary bladder. Stomach/Bowel: Negative rectum. Redundant sigmoid colon with retained stool but otherwise negative. Retained stool in the left colon. Minimal diverticulae, no active inflammation. Continued retained stool throughout the transverse colon which is otherwise negative. Redundant hepatic flexure and right colon which is mostly decompressed. Elongated but normal appendix. Negative terminal ileum. No dilated small bowel. Negative stomach. The second portion of the duodenum is fluid-filled, but not definitely inflamed or abnormal (series 2, image 28). The remaining duodenum is negative. No abdominal free fluid or free air. Vascular/Lymphatic: Extensive Aortoiliac calcified atherosclerosis. Vascular patency is not evaluated in the absence of IV contrast. No lymphadenopathy. Reproductive: Stable small left fat containing inguinal hernia. Other: No pelvic free fluid. Musculoskeletal: Osteopenia. Diffuse lower thoracic and lumbar spinal compression fractures sparing the L3 level. Prior proximal left femur ORIF. Stable visualized osseous structures. IMPRESSION: 1. No convincing acute or inflammatory process in the noncontrast abdomen or pelvis. 2.  Aortic Atherosclerosis (ICD10-I70.0). 3. Chronic cholelithiasis. 4. Chronic spinal compression fractures. Electronically Signed   By: Genevie Ann M.D.   On: 12/07/2016 17:56   ASSESSMENT AND PLAN:   Patient is a 83 year old sent from primary care's office for worsening  anemia and renal failure 1. Acute renal failure on chronic kidney disease Hold Lasix, continue IV fluids Follow-up BMP.  2. Anemia Hb 7.4. No active bleeding. Follow-up hemoglobin and stool occult.  3. Diabetes type 2 start sliding scale insulin and discontinue metformin  4. History of congestive heart failure. Stable. Hold Lasix Hypertension. Continue Coreg.  5. Abdominal pain could be related to constipation Also has significant drastic and lumbar fractures could be causing the symptoms Improved.  All the records are reviewed and case discussed with Care Management/Social Worker. Management plans discussed with the patient, family and they are in agreement.  CODE STATUS: Full Code  TOTAL TIME TAKING CARE OF THIS PATIENT: 36 minutes.   More than 50% of the time was spent in counseling/coordination of care: YES  POSSIBLE D/C IN 1-2 DAYS, DEPENDING ON CLINICAL CONDITION.   Demetrios Loll M.D on 12/08/2016 at 2:53 PM  Between 7am to 6pm - Pager - [phone]  After 6pm go to [email or web address] - [facility]

## 2016-12-09 LAB — BASIC METABOLIC PANEL
ANION GAP: 8 (ref 5–15)
BUN: 62 mg/dL — ABNORMAL HIGH (ref 6–20)
CO2: 21 mmol/L — ABNORMAL LOW (ref 22–32)
Calcium: 7.6 mg/dL — ABNORMAL LOW (ref 8.9–10.3)
Chloride: 109 mmol/L (ref 101–111)
Creatinine, Ser: 2.82 mg/dL — ABNORMAL HIGH (ref 0.61–1.24)
GFR calc Af Amer: 22 mL/min — ABNORMAL LOW (ref >60)
GFR, EST NON AFRICAN AMERICAN: 19 mL/min — AB (ref >60)
Glucose, Bld: 107 mg/dL — ABNORMAL HIGH (ref 65–99)
POTASSIUM: 4.5 mmol/L (ref 3.5–5.1)
SODIUM: 138 mmol/L (ref 135–145)

## 2016-12-09 LAB — GLUCOSE, CAPILLARY
GLUCOSE-CAPILLARY: 123 mg/dL — AB (ref 65–99)
GLUCOSE-CAPILLARY: 106 mg/dL — AB (ref 65–99)
GLUCOSE-CAPILLARY: 163 mg/dL — AB (ref 65–99)
Glucose-Capillary: 93 mg/dL (ref 65–99)

## 2016-12-09 LAB — PROTEIN / CREATININE RATIO, URINE
Creatinine, Urine: 61 mg/dL
Protein Creatinine Ratio: 0.2 mg/mg{Cre} — ABNORMAL HIGH (ref 0.00–0.15)
Total Protein, Urine: 12 mg/dL

## 2016-12-09 LAB — PREPARE RBC (CROSSMATCH)

## 2016-12-09 LAB — ABO/RH: ABO/RH(D): O POS

## 2016-12-09 LAB — HEMOGLOBIN: HEMOGLOBIN: 7 g/dL — AB (ref 13.0–18.0)

## 2016-12-09 MED ORDER — SODIUM CHLORIDE 0.9 % IV SOLN
Freq: Once | INTRAVENOUS | Status: AC
  Administered 2016-12-09: 14:00:00 via INTRAVENOUS

## 2016-12-09 MED ORDER — ACETAMINOPHEN 325 MG PO TABS
650.0000 mg | ORAL_TABLET | Freq: Once | ORAL | Status: AC
  Administered 2016-12-09: 650 mg via ORAL
  Filled 2016-12-09: qty 2

## 2016-12-09 MED ORDER — ENSURE ENLIVE PO LIQD
237.0000 mL | Freq: Three times a day (TID) | ORAL | Status: DC
  Administered 2016-12-09 – 2016-12-11 (×3): 237 mL via ORAL

## 2016-12-09 NOTE — Consult Note (Signed)
[facility] CONSULT NOTE    Date: 12/09/2016                  Patient Name:  Aaron Barnett  MRN: [id number]  DOB: 19-Jun-1927  Age / Sex: 83 y.o., male         PCP: Tracie Harrier, MD                 Service Requesting Consult: Hospitalist                 Reason for Consult: Acute renal failure/CKD stage III            History of Present Illness: Patient is a 83 y.o. male with a PMHx of congestive heart failure and history of cardiomyopathy with biventricular ICD in place, diabetes mellitus type 2, hypertension, prostate cancer, chronic kidney disease stage III Baseline creatinine 1.7, who was admitted to [facility] on 12/07/2016 for evaluation of acute renal failure. He is followed by Dr. Ubaldo Glassing and Dr. Ginette Pitman closely in the office.  Recently his renal function has been deteriorating. His baseline creatinine is 1.7.  At the moment his creatinine is 2.82.  He has underlying heart failure however his most recent ejection fraction was 50% as per his cardiologist. In addition he has worsening anemia with hemoglobin down to 7.0. He does intermittently take Advil at home. It appears that per records from his primary care physician's office he was on Lasix 20 mg by mouth daily.   Medications: Outpatient medications: Prescriptions Prior to Admission  Medication Sig Dispense Refill Last Dose  . amLODipine (NORVASC) 2.5 MG tablet Take 2.5 mg by mouth daily.   12/06/2016 at Unknown time  . aspirin 325 MG tablet Take 325 mg by mouth daily.   12/06/2016 at Unknown time  . Biotin 10 MG TABS Take 1 tablet by mouth daily.   12/06/2016 at Unknown time  . carvedilol (COREG) 6.25 MG tablet Take 6.25 mg by mouth 2 (two) times daily with a meal.   12/06/2016 at Unknown time  . carvedilol (COREG) 6.25 MG tablet Take 1 tablet by mouth 2 (two) times daily.   12/06/2016 at 2000  . fenofibrate (TRICOR) 145 MG tablet Take 145 mg by mouth daily.   12/06/2016 at Unknown time  . metFORMIN  (GLUCOPHAGE-XR) 500 MG 24 hr tablet Take 1 tablet by mouth daily.   12/06/2016 at Unknown time  . Multiple Vitamin (MULTIVITAMIN) capsule Take 1 capsule by mouth daily.   12/06/2016 at Unknown time  . nitroGLYCERIN (NITROSTAT) 0.4 MG SL tablet Place 1 tablet under the tongue every 5 (five) minutes as needed.   prn at prn  . vitamin E 400 UNIT capsule Take 1 capsule by mouth daily.   12/06/2016 at Unknown time  . hydrocortisone 2.5 % cream Apply 1 application topically daily as needed.   prn at prn    Current medications: Current Facility-Administered Medications  Medication Dose Route Frequency Provider Last Rate Last Dose  . 0.9 %  sodium chloride infusion   Intravenous Continuous Dustin Flock, MD 75 mL/hr at 12/09/16 0003    . 0.9 %  sodium chloride infusion   Intravenous Once Demetrios Loll, MD      . acetaminophen (TYLENOL) tablet 650 mg  650 mg Oral Q6H PRN Dustin Flock, MD   650 mg at 12/09/16 0006   Or  . acetaminophen (TYLENOL) suppository 650 mg  650 mg Rectal Q6H PRN Dustin Flock, MD      .  acetaminophen (TYLENOL) tablet 650 mg  650 mg Oral Once Demetrios Loll, MD      . aspirin EC tablet 325 mg  325 mg Oral Daily Dustin Flock, MD   325 mg at 12/09/16 1001  . bisacodyl (DULCOLAX) EC tablet 5 mg  5 mg Oral Daily PRN Demetrios Loll, MD      . carvedilol (COREG) tablet 6.25 mg  6.25 mg Oral BID WC Dustin Flock, MD   6.25 mg at 12/09/16 1001  . docusate sodium (COLACE) capsule 200 mg  200 mg Oral BID Dustin Flock, MD   200 mg at 12/09/16 1001  . heparin injection 5,000 Units  5,000 Units Subcutaneous Q8H Dustin Flock, MD   5,000 Units at 12/09/16 0506  . Influenza vac split quadrivalent PF (FLUZONE HIGH-DOSE) injection 0.5 mL  0.5 mL Intramuscular Tomorrow-1000 Dustin Flock, MD      . insulin aspart (novoLOG) injection 0-5 Units  0-5 Units Subcutaneous QHS Demetrios Loll, MD      . insulin aspart (novoLOG) injection 0-9 Units  0-9 Units Subcutaneous TID WC Demetrios Loll, MD      .  ondansetron [facility]) tablet 4 mg  4 mg Oral Q6H PRN Dustin Flock, MD       Or  . ondansetron [facility]) injection 4 mg  4 mg Intravenous Q6H PRN Dustin Flock, MD   4 mg at 12/09/16 1254  . senna (SENOKOT) tablet 8.6 mg  1 tablet Oral Daily Dustin Flock, MD   8.6 mg at 12/09/16 1001      Allergies: Allergies  Allergen Reactions  . Doxycycline Rash  . Lisinopril Rash  . Penicillins Rash    Has patient had a PCN reaction causing immediate rash, facial/tongue/throat swelling, SOB or lightheadedness with hypotension: Yes Has patient had a PCN reaction causing severe rash involving mucus membranes or skin necrosis: No Has patient had a PCN reaction that required hospitalization: No Has patient had a PCN reaction occurring within the last 10 years: No If all of the above answers are "NO", then may proceed with Cephalosporin use.      Past Medical History: Past Medical History:  Diagnosis Date  . CHF (congestive heart failure) ([place])   . Diabetes mellitus without complication ([place])   . HTN (hypertension)      Past Surgical History: Past Surgical History:  Procedure Laterality Date  . PROSTATECTOMY       Family History: Family History  Problem Relation Age of Onset  . Hypertension Mother      Social History: Social History   Social History  . Marital status: Married    Spouse name: N/A  . Number of children: N/A  . Years of education: N/A   Occupational History  . Not on file.   Social History Main Topics  . Smoking status: Former Smoker    Types: Cigarettes  . Smokeless tobacco: Never Used  . Alcohol use Yes  . Drug use: Unknown  . Sexual activity: Not on file   Other Topics Concern  . Not on file   Social History Narrative  . No narrative on file     Review of Systems: Review of Systems  Constitutional: Positive for malaise/fatigue. Negative for chills and fever.  HENT: Negative for hearing loss and nosebleeds.   Eyes: Negative for blurred  vision and double vision.  Respiratory: Positive for shortness of breath. Negative for cough and hemoptysis.   Cardiovascular: Negative for chest pain, palpitations and orthopnea.  Gastrointestinal: Negative for heartburn,  nausea and vomiting.  Genitourinary: Negative for dysuria, frequency and urgency.  Musculoskeletal: Negative for back pain and myalgias.  Skin: Negative for itching and rash.  Neurological: Positive for weakness. Negative for dizziness and focal weakness.  Endo/Heme/Allergies: Negative for polydipsia. Does not bruise/bleed easily.  Psychiatric/Behavioral: Negative for depression. The patient is not nervous/anxious.      Vital Signs: Blood pressure 115/65, pulse 66, temperature 98.2 F (36.8 C), temperature source Oral, resp. rate 18, height 5\' 10"  (1.778 m), weight 62.1 kg (137 lb), SpO2 100 %.  Weight trends: Filed Weights   12/07/16 1218 12/07/16 2047  Weight: 61.2 kg (135 lb) 62.1 kg (137 lb)    Physical Exam: General: NAD, laying in bed  Head: Normocephalic, atraumatic.  Eyes: Anicteric, EOMI  Nose: Mucous membranes moist, not inflammed, nonerythematous.  Throat: Oropharynx nonerythematous, no exudate appreciated.   Neck: Supple, trachea midline.  Lungs:  Normal respiratory effort. Clear to auscultation BL without crackles or wheezes.  Heart: S1S2 no rubs  Abdomen:  BS normoactive. Soft, Nondistended, non-tender.  No masses or organomegaly.  Extremities: No pretibial edema.  Neurologic: A&O X3, Motor strength is 5/5 in the all 4 extremities  Skin: No visible rashes, scars.    Lab results: Basic Metabolic Panel:  Recent Labs Lab 12/07/16 1233 12/08/16 0358 12/09/16 0319  NA 133* 137 138  K 4.3 4.0 4.5  CL 100* 108 109  CO2 21* 21* 21*  GLUCOSE 105* 77 107*  BUN 68* 63* 62*  CREATININE 3.35* 2.86* 2.82*  CALCIUM 8.6* 7.7* 7.6*    Liver Function Tests:  Recent Labs Lab 12/07/16 1233  AST 34  ALT 21  ALKPHOS 23*  BILITOT 0.8  PROT  6.8  ALBUMIN 3.4*    Recent Labs Lab 12/07/16 1233  LIPASE 94*   No results for input(s): AMMONIA in the last 168 hours.  CBC:  Recent Labs Lab 12/07/16 1233 12/08/16 0358 12/09/16 0319  WBC 7.4 6.7  --   HGB 8.3* 7.4* 7.0*  HCT 24.6* 21.9*  --   MCV 86.9 89.7  --   PLT 507* 436  --     Cardiac Enzymes: No results for input(s): CKTOTAL, CKMB, CKMBINDEX, TROPONINI in the last 168 hours.  BNP: Invalid input(s): POCBNP  CBG:  Recent Labs Lab 12/08/16 1303 12/08/16 1703 12/08/16 2105 12/09/16 0805 12/09/16 1133  GLUCAP 118* 106* 120* 93 123*    Microbiology: No results found for this or any previous visit.  Coagulation Studies: No results for input(s): LABPROT, INR in the last 72 hours.  Urinalysis:  Recent Labs  12/08/16 0040  [place] 1.006  PHURINE 6.0  GLUCOSEU NEGATIVE  HGBUR NEGATIVE  BILIRUBINUR NEGATIVE  KETONESUR NEGATIVE  PROTEINUR NEGATIVE  NITRITE NEGATIVE  LEUKOCYTESUR NEGATIVE      Imaging: Ct Renal Stone Study  Result Date: 12/07/2016 CLINICAL DATA:  83 year old male with weakness, generalized pain, anemia, renal failure. EXAM: CT ABDOMEN AND PELVIS WITHOUT CONTRAST TECHNIQUE: Multidetector CT imaging of the abdomen and pelvis was performed following the standard protocol without IV contrast. COMPARISON:  04/18/2015 CT Abdomen and Pelvis FINDINGS: Lower chest: Partially visible cardiac pacemaker or AICD leads. No pericardial effusion. Mild cardiomegaly. Negative lung bases. No pleural effusion. Hepatobiliary: Small layering cholelithiasis. No pericholecystic inflammation. Negative noncontrast liver ; small chronic calcified granulomas in the left lobe. Pancreas: Within normal limits. Spleen: Negative. Adrenals/Urinary Tract: Normal adrenal glands. Bilateral noncontrast kidneys appears stable and normal for age. No hydroureter. Negative course of both  ureters. Small right hemipelvis phleboliths are stable. Unremarkable  urinary bladder. Stomach/Bowel: Negative rectum. Redundant sigmoid colon with retained stool but otherwise negative. Retained stool in the left colon. Minimal diverticulae, no active inflammation. Continued retained stool throughout the transverse colon which is otherwise negative. Redundant hepatic flexure and right colon which is mostly decompressed. Elongated but normal appendix. Negative terminal ileum. No dilated small bowel. Negative stomach. The second portion of the duodenum is fluid-filled, but not definitely inflamed or abnormal (series 2, image 28). The remaining duodenum is negative. No abdominal free fluid or free air. Vascular/Lymphatic: Extensive Aortoiliac calcified atherosclerosis. Vascular patency is not evaluated in the absence of IV contrast. No lymphadenopathy. Reproductive: Stable small left fat containing inguinal hernia. Other: No pelvic free fluid. Musculoskeletal: Osteopenia. Diffuse lower thoracic and lumbar spinal compression fractures sparing the L3 level. Prior proximal left femur ORIF. Stable visualized osseous structures. IMPRESSION: 1. No convincing acute or inflammatory process in the noncontrast abdomen or pelvis. 2.  Aortic Atherosclerosis (ICD10-I70.0). 3. Chronic cholelithiasis. 4. Chronic spinal compression fractures. Electronically Signed   By: Genevie Ann M.D.   On: 12/07/2016 17:56      Assessment & Plan: Pt is a 83 y.o. male with a PMHx of congestive heart failure and history of cardiomyopathy with biventricular ICD in place, diabetes mellitus type 2, hypertension, prostate cancer, chronic kidney disease stage III Baseline creatinine 1.7, who was admitted to [facility] on 12/07/2016 for evaluation of acute renal failure and worsening anemia.   1. Acute renal failure/chronic kidney disease stage III Baseline creatinine 1.7. Unclear as to what has led to the patient's acute renal failure now. It could be related to worsening anemia and blood loss. No sign of volume overload at  the moment. Patient was also previously on Lasix. His most recent ejection fraction was 50%. As such we will start the patient on gentle IV fluid hydration with 0.9 normal saline at40 cc per hour. He had recentCT scan of the abdomen and pelvis which was negative for any hydronephrosis. We also plan to check SPEP, UPEP, and ANA.  2. Anemia of chronic kidney disease. The patient's baseline hemoglobin appears to be 10.7. Hemoglobin now down to 7. Consider blood transfusion. Defer to primary team.  3.  Thanks for consultation.

## 2016-12-09 NOTE — Progress Notes (Signed)
Called Dr. Estanislado Pandy regarding patient's hemoglobin level of 7.0.  Will continue to monitor patient.  Christene Slates  12/09/2016  4:56 AM

## 2016-12-09 NOTE — Progress Notes (Signed)
Initial Nutrition Assessment  DOCUMENTATION CODES:   Severe malnutrition in context of chronic illness  INTERVENTION:  Recommend downgrading diet to dysphagia 3 (mechanical soft) with thin liquids.  Recommend obtaining SLP consult as patient is reporting difficulty chewing and swallowing.  Provide Ensure Enlive po TID, each supplement provides 350 kcal and 20 grams of protein.   Discussed choosing soft sources of protein that will be easier for patient to chew and swallow. Encouraged adequate intake of calories and protein.  NUTRITION DIAGNOSIS:   Malnutrition (Severe) related to chronic illness (CHF, CKD) as evidenced by severe depletion of body fat, severe depletion of muscle mass.  GOAL:   Patient will meet greater than or equal to 90% of their needs  MONITOR:   PO intake, Supplement acceptance, Labs, Weight trends, I & O's  REASON FOR ASSESSMENT:   Malnutrition Screening Tool    ASSESSMENT:   83 year old male with PMHx of DM type 2, CHF, HTN admitted with ARF on CKD, anemia of chronic disease.   Spoke with patient and his wife at bedside. Patient is hard of hearing. 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 Patient reports his appetite is okay, but he has not been eating much for a month now. He endorses some difficulty chewing and swallowing. For breakfast he may have waffles with bacon or sausage and cheese. For dinner he has meat with a carbohydrate and fruit or vegetable. He then has a dinner snack before bedtime of chips or nuts. Per wife patient is only eating very small amounts at a time. Wife reports patient's diabetes is diet-controlled simply by patient not eating sweets and blood sugar does not usually get elevated. Patient reports his MD asked him to eat red meat to help with his hemoglobin levels, but he cannot chew red meat very well. Wife reports patient is mildly lactose-intolerant.  Patient reports UBW was 150 lbs. Per review of weight history in  chart patient was 162.2 lbs on 12/07/2015. He has lost 25.2 lbs (15.5% body weight) over the past year, which is not significant for time frame.  Medications reviewed and include: Colace, Novolog 0-9 units TID, Novolog 0-5 units QHS, senna, NS @ 75 ml/hr.  Labs reviewed: CBG 93-123, CO2 21, BUN 62, Creatinine 2.82.  Nutrition-Focused physical exam completed. Findings are severe fat depletion, severe muscle depletion, and no edema.   Discussed with RN.  Diet Order:  Diet heart healthy/carb modified Room service appropriate? Yes; Fluid consistency: Thin  Skin:  Wound (see comment) (abrasions to ankle, arm, knee)  Last BM:  12/08/2016 - small type 5  Height:   Ht Readings from Last 1 Encounters:  12/07/16 5\' 10"  (1.778 m)    Weight:   Wt Readings from Last 1 Encounters:  12/07/16 137 lb (62.1 kg)    Ideal Body Weight:  75.5 kg  BMI:  Body mass index is 19.66 kg/m.  Estimated Nutritional Needs:   Kcal:  1570-1830 (MSJ x 1.2-1.4)  Protein:  87-100 grams (1.4-1.6 grams/kg)  Fluid:  1.5 L/day (25 ml/kg)  EDUCATION NEEDS:   Education needs addressed  Willey Blade, [place], RD, [place] Office: [phone] Pager: [phone] After Hours/Weekend Pager: [phone]

## 2016-12-09 NOTE — Progress Notes (Signed)
Blood transfusion is complete without complications. Patient tolerated well.

## 2016-12-09 NOTE — Progress Notes (Signed)
[facility] [place] NAME: Aaron Barnett    MR#:  [id number]  DATE OF BIRTH:  1927-06-25  SUBJECTIVE:  CHIEF COMPLAINT:  No chief complaint on file.  Generalized weakness.Marland Kitchen REVIEW OF SYSTEMS:  Review of Systems  Constitutional: Positive for malaise/fatigue. Negative for chills and fever.  HENT: Negative for sore throat.   Eyes: Negative for blurred vision and double vision.  Respiratory: Negative for cough, hemoptysis, shortness of breath, wheezing and stridor.   Cardiovascular: Negative for chest pain, palpitations, orthopnea and leg swelling.  Gastrointestinal: Negative for abdominal pain, blood in stool, diarrhea, melena, nausea and vomiting.  Genitourinary: Negative for dysuria, flank pain and hematuria.  Musculoskeletal: Negative for back pain and joint pain.  Skin: Negative for rash.  Neurological: Positive for weakness. Negative for dizziness, sensory change, focal weakness, seizures, loss of consciousness and headaches.  Endo/Heme/Allergies: Negative for polydipsia.  Psychiatric/Behavioral: Negative for depression. The patient is not nervous/anxious.     DRUG ALLERGIES:   Allergies  Allergen Reactions  . Doxycycline Rash  . Lisinopril Rash  . Penicillins Rash    Has patient had a PCN reaction causing immediate rash, facial/tongue/throat swelling, SOB or lightheadedness with hypotension: Yes Has patient had a PCN reaction causing severe rash involving mucus membranes or skin necrosis: No Has patient had a PCN reaction that required hospitalization: No Has patient had a PCN reaction occurring within the last 10 years: No If all of the above answers are "NO", then may proceed with Cephalosporin use.   VITALS:  Blood pressure 115/65, pulse 66, temperature 98.2 F (36.8 C), temperature source Oral, resp. rate 18, height 5\' 10"  (1.778 m), weight 137 lb (62.1 kg), SpO2 100 %. PHYSICAL EXAMINATION:  Physical Exam  Constitutional:  He is oriented to person, place, and time and well-developed, well-nourished, and in no distress.  HENT:  Head: Normocephalic.  Mouth/Throat: Oropharynx is clear and moist.  Eyes: Pupils are equal, round, and reactive to light. Conjunctivae and EOM are normal. No scleral icterus.  Neck: Normal range of motion. Neck supple. No JVD present. No tracheal deviation present.  Cardiovascular: Normal rate, regular rhythm and normal heart sounds.  Exam reveals no gallop.   No murmur heard. Pulmonary/Chest: Effort normal and breath sounds normal. No respiratory distress. He has no wheezes. He has no rales.  Abdominal: Soft. Bowel sounds are normal. He exhibits no distension. There is no tenderness. There is no rebound.  Musculoskeletal: Normal range of motion. He exhibits no edema or tenderness.  Neurological: He is alert and oriented to person, place, and time. No cranial nerve deficit.  Skin: No rash noted. No erythema.  Psychiatric: Affect normal.   LABORATORY PANEL:  Male CBC  Recent Labs Lab 12/08/16 0358 12/09/16 0319  WBC 6.7  --   HGB 7.4* 7.0*  HCT 21.9*  --   PLT 436  --    ------------------------------------------------------------------------------------------------------------------ Chemistries   Recent Labs Lab 12/07/16 1233  12/09/16 0319  NA 133*  < > 138  K 4.3  < > 4.5  CL 100*  < > 109  CO2 21*  < > 21*  GLUCOSE 105*  < > 107*  BUN 68*  < > 62*  CREATININE 3.35*  < > 2.82*  CALCIUM 8.6*  < > 7.6*  AST 34  --   --   ALT 21  --   --   ALKPHOS 23*  --   --   BILITOT 0.8  --   --   < > =  values in this interval not displayed. RADIOLOGY:  No results found. ASSESSMENT AND PLAN:   Patient is a 83 year old sent from primary care's office for worsening anemia and renal failure 1. Acute renal failure on chronic kidney disease Hold Lasix, continue IV fluids Follow-up BMP.  2. Anemia Of chronic kidney disease Hb down to 7.0.. No active bleeding.  Give 1 unit  PRBC transfusion and Follow-up hemoglobin. Negative stool occult.  3. Diabetes type 2 started sliding scale insulin and discontinued metformin  4. History of congestive heart failure. Stable. Hold Lasix Hypertension. Continue Coreg.  5. Abdominal pain could be related to constipation Also has significant drastic and lumbar fractures could be causing the symptoms Improved.  All the records are reviewed and case discussed with Care Management/Social Worker. Management plans discussed with the patient, family and they are in agreement.  CODE STATUS: Full Code  TOTAL TIME TAKING CARE OF THIS PATIENT: 33 minutes.   More than 50% of the time was spent in counseling/coordination of care: YES  POSSIBLE D/C IN 1-2 DAYS, DEPENDING ON CLINICAL CONDITION.   Demetrios Loll M.D on 12/09/2016 at 1:10 PM  Between 7am to 6pm - Pager - [phone]  After 6pm go to [email or web address] - [place] [facility]

## 2016-12-09 NOTE — Evaluation (Signed)
Physical Therapy Evaluation Patient Details Name: Aaron Barnett MRN: [id number] DOB: 1927/08/01 Today's Date: 12/09/2016   History of Present Illness  83 y.o. male with a known history of congestive heart failure, diabetes type 2, essential hypertension who was sent by his primary care 47 office for anemia as well as acute renal failure.  Pt to have a unit of RBC but is asymptomatic and nursing okayed working with PT this AM.  Clinical Impression  Pt reports feeling generally weak, but did have prolonged dizziness with getting to sitting and was asymptomatic despite HGB of 7.0 during exam/ambulation.  He was slow, but steady and safe with 200 ft of ambulation using QC.  Pt has been going to PT at [facility] for LE strengthening and balance activities, feels he has improved functionally and is eager to continue with this once discharged from the hospital.  This PT is in agreement with this plan, pt safe to go home on discharge from PT standpoint.     Follow Up Recommendations  (was having PT at [facility] 2-3x/week, recommend to continue)    Equipment Recommendations       Recommendations for Other Services       Precautions / Restrictions Precautions Precautions: Fall Restrictions Weight Bearing Restrictions: No      Mobility  Bed Mobility Overal bed mobility: Independent                Transfers Overall transfer level: Independent Equipment used: Scientist, research (medical) transfer comment: Pt needed only very minimal cuing for set up, able to rise w/o assist  Ambulation/Gait Ambulation/Gait assistance: Supervision Ambulation Distance (Feet): 200 Feet Assistive device: Quad cane   Gait velocity: Pt walked with good speed and confidence, had no LOBs and generally did quite well.       Stairs            Wheelchair Mobility    Modified Rankin (Stroke Patients Only)       Balance Overall balance assessment: Modified Independent                                            Pertinent Vitals/Pain Pain Assessment: No/denies pain    Home Living Family/patient expects to be discharged to:: Private residence Living Arrangements: Spouse/significant other   Type of Home: Apartment Home Access: Elevator     Home Layout: One level Home Equipment: Cane - quad      Prior Function Level of Independence: Independent with assistive device(s)         Comments: Pt is able to do all he needs, does some driving, running errands     Hand Dominance        Extremity/Trunk Assessment   Upper Extremity Assessment Upper Extremity Assessment: Overall WFL for tasks assessed    Lower Extremity Assessment Lower Extremity Assessment: Overall WFL for tasks assessed       Communication   Communication: No difficulties  Cognition Arousal/Alertness: Awake/alert Behavior During Therapy: WFL for tasks assessed/performed Overall Cognitive Status: Within Functional Limits for tasks assessed                                        General Comments  Exercises     Assessment/Plan    PT Assessment Patient needs continued PT services  PT Problem List Decreased strength;Decreased range of motion;Decreased activity tolerance;Decreased balance;Decreased mobility;Decreased knowledge of use of DME;Decreased safety awareness       PT Treatment Interventions DME instruction;Gait training;Stair training;Functional mobility training;Therapeutic activities;Therapeutic exercise;Balance training;Neuromuscular re-education;Patient/family education    PT Goals (Current goals can be found in the Care Plan section)  Acute Rehab PT Goals Patient Stated Goal: get home PT Goal Formulation: With patient Time For Goal Achievement: 12/23/16 Potential to Achieve Goals: Good    Frequency Min 2X/week   Barriers to discharge        Co-evaluation               AM-PAC PT "6 Clicks" Daily Activity   Outcome Measure Difficulty turning over in bed (including adjusting bedclothes, sheets and blankets)?: None Difficulty moving from lying on back to sitting on the side of the bed? : None Difficulty sitting down on and standing up from a chair with arms (e.g., wheelchair, bedside commode, etc,.)?: None Help needed moving to and from a bed to chair (including a wheelchair)?: None Help needed walking in hospital room?: None Help needed climbing 3-5 steps with a railing? : None 6 Click Score: 24    End of Session Equipment Utilized During Treatment: Gait belt Activity Tolerance: Patient tolerated treatment well Patient left: with bed alarm set;with call bell/phone within reach Nurse Communication: Mobility status PT Visit Diagnosis: Muscle weakness (generalized) (M62.81);Difficulty in walking, not elsewhere classified (R26.2)    Time: 9417-4081 PT Time Calculation (min) (ACUTE ONLY): 22 min   Charges:   PT Evaluation $PT Eval Low Complexity: 1 Low     PT G Codes:   PT G-Codes **NOT FOR INPATIENT CLASS** Functional Assessment Tool Used: Clinical judgement Functional Limitation: Mobility: Walking and moving around Mobility: Walking and Moving Around Current Status (K4818): At least 1 percent but less than 20 percent impaired, limited or restricted Mobility: Walking and Moving Around Goal Status [phone]): 0 percent impaired, limited or restricted    Kreg Shropshire, DPT 12/09/2016, 11:31 AM

## 2016-12-10 LAB — BASIC METABOLIC PANEL
ANION GAP: 6 (ref 5–15)
BUN: 58 mg/dL — ABNORMAL HIGH (ref 6–20)
CALCIUM: 7.4 mg/dL — AB (ref 8.9–10.3)
CO2: 20 mmol/L — AB (ref 22–32)
Chloride: 112 mmol/L — ABNORMAL HIGH (ref 101–111)
Creatinine, Ser: 2.6 mg/dL — ABNORMAL HIGH (ref 0.61–1.24)
GFR calc non Af Amer: 21 mL/min — ABNORMAL LOW (ref >60)
GFR, EST AFRICAN AMERICAN: 24 mL/min — AB (ref >60)
Glucose, Bld: 113 mg/dL — ABNORMAL HIGH (ref 65–99)
Potassium: 4.7 mmol/L (ref 3.5–5.1)
Sodium: 138 mmol/L (ref 135–145)

## 2016-12-10 LAB — PROTEIN ELECTROPHORESIS, SERUM
A/G Ratio: 1 (ref 0.7–1.7)
Albumin ELP: 3.1 g/dL (ref 2.9–4.4)
Alpha-1-Globulin: 0.3 g/dL (ref 0.0–0.4)
Alpha-2-Globulin: 0.9 g/dL (ref 0.4–1.0)
Beta Globulin: 1 g/dL (ref 0.7–1.3)
GLOBULIN, TOTAL: 3 g/dL (ref 2.2–3.9)
Gamma Globulin: 0.8 g/dL (ref 0.4–1.8)
TOTAL PROTEIN ELP: 6.1 g/dL (ref 6.0–8.5)

## 2016-12-10 LAB — BPAM RBC
BLOOD PRODUCT EXPIRATION DATE: 201810172359
ISSUE DATE / TIME: 201810141357
Unit Type and Rh: 9500

## 2016-12-10 LAB — GLUCOSE, CAPILLARY
GLUCOSE-CAPILLARY: 97 mg/dL (ref 65–99)
GLUCOSE-CAPILLARY: 127 mg/dL — AB (ref 65–99)
GLUCOSE-CAPILLARY: 122 mg/dL — AB (ref 65–99)
Glucose-Capillary: 108 mg/dL — ABNORMAL HIGH (ref 65–99)

## 2016-12-10 LAB — TYPE AND SCREEN
ABO/RH(D): O POS
Antibody Screen: NEGATIVE
Unit division: 0

## 2016-12-10 LAB — HEMOGLOBIN: Hemoglobin: 8.2 g/dL — ABNORMAL LOW (ref 13.0–18.0)

## 2016-12-10 MED ORDER — ALUM & MAG HYDROXIDE-SIMETH 200-200-20 MG/5ML PO SUSP
30.0000 mL | ORAL | Status: DC | PRN
  Administered 2016-12-10: 30 mL via ORAL
  Filled 2016-12-10: qty 30

## 2016-12-10 MED ORDER — ALBUTEROL SULFATE (2.5 MG/3ML) 0.083% IN NEBU
2.5000 mg | INHALATION_SOLUTION | Freq: Four times a day (QID) | RESPIRATORY_TRACT | Status: DC | PRN
  Administered 2016-12-10 (×2): 2.5 mg via RESPIRATORY_TRACT
  Filled 2016-12-10 (×2): qty 3

## 2016-12-10 NOTE — Evaluation (Signed)
Clinical/Bedside Swallow Evaluation Patient Details  Name: Aaron Barnett MRN: [id number] Date of Birth: 25-Sep-1927  Today's Date: 12/10/2016 Time: SLP Start Time (ACUTE ONLY): 9 SLP Stop Time (ACUTE ONLY): 1250 SLP Time Calculation (min) (ACUTE ONLY): 20 min  Past Medical History:  Past Medical History:  Diagnosis Date  . CHF (congestive heart failure) ([place])   . Diabetes mellitus without complication ([place])   . HTN (hypertension)    Past Surgical History:  Past Surgical History:  Procedure Laterality Date  . PROSTATECTOMY     HPI:  83 year old male admitted 12/07/16 with anemia and acute renal failure. PMH significant for CHF, DM, HTN. BSE ordered after pt choked on eggs this morning.   Assessment / Plan / Recommendation Clinical Impression  Pt reported getting choked on eggs this morning, stating they got "stuck". He attributes this to mucus which he has been coughing up, and reported he thought it was better now. Pt presents with adequate oral motor strength and function. No overt s/s aspiration on thin liquid or puree consistencies, however, pt reports globus sensation after swallowing a piece of chicken from his soup. This raises suspicion for primary esophageal dysphagia. Pt was given sprite to facilitate esophageal clearing.   Recommend consideration of REGULAR BARIUM SWALLOW (not Modified Barium Swallow - MBS) to evaluate esophageal (dys)motility. MD text paged with this recommendation. In the interim, change in diet consistency to full liquid may be beneficial to facilitate esophageal clearing.   ST will follow up after esophageal work up is completed, and results are available.     SLP Visit Diagnosis: Dysphagia, pharyngoesophageal phase (R13.14) (suspect primary esophageal dysphagia)    Aspiration Risk  Mild aspiration risk;Moderate aspiration risk    Diet Recommendation Thin liquid (full liquid)   Liquid Administration via: Cup;Straw Medication Administration: Whole  meds with liquid (crush large pills if pt reports difficulty with meds.) Supervision: Patient able to self feed;Intermittent supervision to cue for compensatory strategies Compensations: Minimize environmental distractions;Slow rate;Small sips/bites;Follow solids with liquid Postural Changes: Seated upright at 90 degrees;Remain upright for at least 30 minutes after po intake    Other  Recommendations Recommended Consults: Consider esophageal assessment Oral Care Recommendations: Oral care QID   Follow up Recommendations None      Frequency and Duration  (pending results)          Prognosis Prognosis for Safe Diet Advancement: [place] Date of Onset: 12/07/16 HPI: 83 year old male admitted 12/07/16 with anemia and acute renal failure. PMH significant for CHF, DM, HTN. BSE ordered after pt choked on eggs this morning. Type of Study: Bedside Swallow Evaluation Previous Swallow Assessment: no prior ST intervention Diet Prior to this Study: Dysphagia 3 (soft);Thin liquids Temperature Spikes Noted: No Respiratory Status: Room air History of Recent Intubation: No Behavior/Cognition: Alert;Cooperative;Pleasant mood Oral Cavity Assessment: Within Functional Limits Oral Care Completed by SLP: No Oral Cavity - Dentition: Edentulous Vision: Functional for self-feeding Self-Feeding Abilities: Able to feed self Patient Positioning: Upright in bed Baseline Vocal Quality: Normal Volitional Cough: Strong Volitional Swallow: Able to elicit    Oral/Motor/Sensory Function Overall Oral Motor/Sensory Function: Within functional limits   Ice Chips Ice chips: Not tested   Thin Liquid Thin Liquid: Within functional limits Presentation: Straw    Nectar Thick Nectar Thick Liquid: Not tested   Honey Thick Honey Thick Liquid: Not tested   Puree Puree: Within functional limits Presentation: Spoon;Self Fed   Solid  GO   Solid: Impaired Other Comments: globus sensation  after swallowing a piece of chicken from his soup.    Functional Assessment Tool Used: asha noms, clinical judgment, BSE Functional Limitations: Swallowing Swallow Current Status (P1031): At least 20 percent but less than 40 percent impaired, limited or restricted Swallow Goal Status [phone]): At least 20 percent but less than 40 percent impaired, limited or restricted   Renee Erb B. Quentin Ore, [facility], [place] Speech Language Pathologist [phone]  Shonna Chock 12/10/2016,1:25 PM

## 2016-12-10 NOTE — Plan of Care (Signed)
Problem: Nutrition: Goal: Adequate nutrition will be maintained Outcome: Not Progressing Pt has not ate much today due to trouble swallowing

## 2016-12-10 NOTE — Progress Notes (Signed)
Patient complains of SOB, repositioned, HOB elevated, C/DB; Respiratory therapist paged for SVN prn; will come and administer Rx. Barbaraann Faster, RN 8:54 PM 12/10/2016

## 2016-12-10 NOTE — Progress Notes (Signed)
[facility] [place] NAME: Aaron Barnett    MR#:  [id number]  DATE OF BIRTH:  August 24, 1927  SUBJECTIVE:   Patient complained of some swallowing difficulty. Is on dysphagia 3 diet. No vomiting. Denies any pain. REVIEW OF SYSTEMS:   Review of Systems  Constitutional: Negative for chills, fever and weight loss.  HENT: Negative for ear discharge, ear pain and nosebleeds.   Eyes: Negative for blurred vision, pain and discharge.  Respiratory: Negative for sputum production, shortness of breath, wheezing and stridor.   Cardiovascular: Negative for chest pain, palpitations, orthopnea and PND.  Gastrointestinal: Negative for abdominal pain, diarrhea, nausea and vomiting.  Genitourinary: Negative for frequency and urgency.  Musculoskeletal: Negative for back pain and joint pain.  Neurological: Negative for sensory change, speech change, focal weakness and weakness.  Psychiatric/Behavioral: Negative for depression and hallucinations. The patient is not nervous/anxious.    Tolerating Diet:yes Tolerating PT: HHPT  DRUG ALLERGIES:   Allergies  Allergen Reactions  . Doxycycline Rash  . Lisinopril Rash  . Penicillins Rash    Has patient had a PCN reaction causing immediate rash, facial/tongue/throat swelling, SOB or lightheadedness with hypotension: Yes Has patient had a PCN reaction causing severe rash involving mucus membranes or skin necrosis: No Has patient had a PCN reaction that required hospitalization: No Has patient had a PCN reaction occurring within the last 10 years: No If all of the above answers are "NO", then may proceed with Cephalosporin use.    VITALS:  Blood pressure 121/71, pulse 73, temperature 98.2 F (36.8 C), temperature source Oral, resp. rate 20, height 5\' 10"  (1.778 m), weight 62.1 kg (137 lb), SpO2 99 %.  PHYSICAL EXAMINATION:   Physical Exam  GENERAL:  83 y.o.-year-old patient lying in the bed with no acute  distress.  EYES: Pupils equal, round, reactive to light and accommodation. No scleral icterus. Extraocular muscles intact.  HEENT: Head atraumatic, normocephalic. Oropharynx and nasopharynx clear.  NECK:  Supple, no jugular venous distention. No thyroid enlargement, no tenderness.  LUNGS: Normal breath sounds bilaterally, no wheezing, rales, rhonchi. No use of accessory muscles of respiration.  CARDIOVASCULAR: S1, S2 normal. No murmurs, rubs, or gallops.  ABDOMEN: Soft, nontender, nondistended. Bowel sounds present. No organomegaly or mass.  EXTREMITIES: No cyanosis, clubbing or edema b/l.    NEUROLOGIC: Cranial nerves II through XII are intact. No focal Motor or sensory deficits b/l.   PSYCHIATRIC:  patient is alert and oriented x 3.  SKIN: No obvious rash, lesion, or ulcer.   LABORATORY PANEL:  CBC  Recent Labs Lab 12/08/16 0358  12/10/16 0304  WBC 6.7  --   --   HGB 7.4*  < > 8.2*  HCT 21.9*  --   --   PLT 436  --   --   < > = values in this interval not displayed.  Chemistries   Recent Labs Lab 12/07/16 1233  12/10/16 0304  NA 133*  < > 138  K 4.3  < > 4.7  CL 100*  < > 112*  CO2 21*  < > 20*  GLUCOSE 105*  < > 113*  BUN 68*  < > 58*  CREATININE 3.35*  < > 2.60*  CALCIUM 8.6*  < > 7.4*  AST 34  --   --   ALT 21  --   --   ALKPHOS 23*  --   --   BILITOT 0.8  --   --   < > =  values in this interval not displayed. Cardiac Enzymes No results for input(s): TROPONINI in the last 168 hours. RADIOLOGY:  No results found. ASSESSMENT AND PLAN:  83 year old sent from primary care's office for worsening anemia and renal failure  1. Acute renal failure on chronic kidney disease Hold Lasix - continue IV fluids Baseline creatinine around 1.7-1.9 -Came in with creatinine of 3.35--- 2.82--2.60 -Nephrology consultation appreciated  2. Anemia Of chronic kidney disease Hb down to 7.0.--- One unit blood transfusion --8.2 No active bleeding.  - Negative stool  occult. -Patient is not keen on getting GI workup in the form of endoscopies done. He wants to hold it off as he is not actively bleeding  3. Diabetes type 2 started sliding scale insulin and discontinued metformin  4. History of congestive heart failure. Stable. Hold Lasix Hypertension. Continue Coreg.  5. Discharge planning patient is from [facility] [place] in dependent living and will discharge with home health PT.  Case discussed with Care Management/Social Worker. Management plans discussed with the patient, family and they are in agreement.  CODE STATUS: FULL  DVT Prophylaxis: heparin   TOTAL TIME TAKING CARE OF THIS PATIENT: *30* minutes.  >50% time spent on counselling and coordination of care  POSSIBLE D/C IN *1-2* DAYS, DEPENDING ON CLINICAL CONDITION.  Note: This dictation was prepared with Dragon dictation along with smaller phrase technology. Any transcriptional errors that result from this process are unintentional.  Mailey Landstrom M.D on 12/10/2016 at 10:07 AM  Between 7am to 6pm - Pager - [phone]  After 6pm go to [email or web address] - password [email or web address] [place] [facility]  Office  [phone]  CC: Primary care physician; Tracie Harrier, MDPatient ID: Aaron Barnett, male   DOB: Nov 22, 1927, 83 y.o.   MRN: [id number]

## 2016-12-10 NOTE — Progress Notes (Signed)
Notified the MD of patient's complaint of SOB and orders taken, SVN given.

## 2016-12-10 NOTE — Progress Notes (Signed)
[facility]  ROUNDING NOTE   Subjective:   Sitting in chair  Difficulty with swallowing this morning.   Creatinine 2.6 (2.82)  NS at 75  Status post PRBC transfusion yesterday  Objective:  Vital signs in last 24 hours:  Temp:  [97.5 F (36.4 C)-98.4 F (36.9 C)] 98.2 F (36.8 C) (10/15 0446) Pulse Rate:  [64-76] 73 (10/15 0446) Resp:  [16-20] 20 (10/15 0446) BP: (88-121)/(51-71) 121/71 (10/15 0446) SpO2:  [95 %-100 %] 99 % (10/15 0446)  Weight change:  Filed Weights   12/07/16 1218 12/07/16 2047  Weight: 61.2 kg (135 lb) 62.1 kg (137 lb)    Intake/Output: I/O last 3 completed shifts: In: 2715 [P.O.:360; I.V.:2095; Blood:260] Out: 825 [Urine:825]   Intake/Output this shift:  Total I/O In: -  Out: 250 [Urine:250]  Physical Exam: General: NAD, sitting in chair  Head: Normocephalic, atraumatic. Moist oral mucosal membranes  Eyes: Anicteric, PERRL  Neck: Supple, trachea midline  Lungs:  Clear to auscultation  Heart: Regular rate and rhythm  Abdomen:  Soft, nontender,   Extremities: no peripheral edema.  Neurologic: Nonfocal, moving all four extremities  Skin: No lesions       Basic Metabolic Panel:  Recent Labs Lab 12/07/16 1233 12/08/16 0358 12/09/16 0319 12/10/16 0304  NA 133* 137 138 138  K 4.3 4.0 4.5 4.7  CL 100* 108 109 112*  CO2 21* 21* 21* 20*  GLUCOSE 105* 77 107* 113*  BUN 68* 63* 62* 58*  CREATININE 3.35* 2.86* 2.82* 2.60*  CALCIUM 8.6* 7.7* 7.6* 7.4*    Liver Function Tests:  Recent Labs Lab 12/07/16 1233  AST 34  ALT 21  ALKPHOS 23*  BILITOT 0.8  PROT 6.8  ALBUMIN 3.4*    Recent Labs Lab 12/07/16 1233  LIPASE 94*   No results for input(s): AMMONIA in the last 168 hours.  CBC:  Recent Labs Lab 12/07/16 1233 12/08/16 0358 12/09/16 0319 12/10/16 0304  WBC 7.4 6.7  --   --   HGB 8.3* 7.4* 7.0* 8.2*  HCT 24.6* 21.9*  --   --   MCV 86.9 89.7  --   --   PLT 507* 436  --   --     Cardiac  Enzymes: No results for input(s): CKTOTAL, CKMB, CKMBINDEX, TROPONINI in the last 168 hours.  BNP: Invalid input(s): POCBNP  CBG:  Recent Labs Lab 12/09/16 0805 12/09/16 1133 12/09/16 1651 12/09/16 2112 12/10/16 0733  GLUCAP 93 123* 106* 163* 66    Microbiology: No results found for this or any previous visit.  Coagulation Studies: No results for input(s): LABPROT, INR in the last 72 hours.  Urinalysis:  Recent Labs  12/08/16 0040  COLORURINE STRAW*  LABSPEC 1.006  PHURINE 6.0  GLUCOSEU NEGATIVE  HGBUR NEGATIVE  BILIRUBINUR NEGATIVE  KETONESUR NEGATIVE  PROTEINUR NEGATIVE  NITRITE NEGATIVE  LEUKOCYTESUR NEGATIVE      Imaging: No results found.   Medications:   . sodium chloride 75 mL/hr at 12/10/16 0529   . aspirin EC  325 mg Oral Daily  . carvedilol  6.25 mg Oral BID WC  . docusate sodium  200 mg Oral BID  . feeding supplement (ENSURE ENLIVE)  237 mL Oral TID BM  . heparin  5,000 Units Subcutaneous Q8H  . Influenza vac split quadrivalent PF  0.5 mL Intramuscular Tomorrow-1000  . insulin aspart  0-5 Units Subcutaneous QHS  . insulin aspart  0-9 Units Subcutaneous TID WC  . senna  1 tablet  Oral Daily   acetaminophen **OR** acetaminophen, albuterol, alum & mag hydroxide-simeth, bisacodyl, ondansetron **OR** ondansetron (ZOFRAN) IV  Assessment/ Plan:  [name] is a 83 y.o. white male withcongestive heart failure and history of cardiomyopathy with biventricular ICD in place, diabetes mellitus type 2, hypertension, prostate cancer  1. Acute renal failure on chronic kidney disease stage III Baseline creatinine 1.7, GFR of 42 Acute renal failure secondary to anemia and prerenal azotemia. Improving with IV fluids Chronic kidney disease secondary to diabetes and hypertension. Bland urine on admission - Continue IV fluids - monitor volume status. Holding furosemide.   2. Anemia with renal failure: status post PRBC transfusion - Appreciate GI input.    3. Diabetes mellitus type II with chronic kidney disease: holding metformin  4. Hypertension: blood pressure at goal.  - carvedilol   LOS: 3 Aaron Barnett 10/15/201810:53 AM

## 2016-12-11 LAB — PROTEIN ELECTROPHORESIS, SERUM
A/G RATIO SPE: 1.1 (ref 0.7–1.7)
ALBUMIN ELP: 2.6 g/dL — AB (ref 2.9–4.4)
ALPHA-1-GLOBULIN: 0.3 g/dL (ref 0.0–0.4)
ALPHA-2-GLOBULIN: 0.7 g/dL (ref 0.4–1.0)
BETA GLOBULIN: 0.8 g/dL (ref 0.7–1.3)
GLOBULIN, TOTAL: 2.4 g/dL (ref 2.2–3.9)
Gamma Globulin: 0.6 g/dL (ref 0.4–1.8)
Total Protein ELP: 5 g/dL — ABNORMAL LOW (ref 6.0–8.5)

## 2016-12-11 LAB — GLUCOSE, CAPILLARY
GLUCOSE-CAPILLARY: 146 mg/dL — AB (ref 65–99)
Glucose-Capillary: 101 mg/dL — ABNORMAL HIGH (ref 65–99)

## 2016-12-11 LAB — PROTEIN ELECTRO, RANDOM URINE
ALBUMIN ELP UR: 50.3 %
ALPHA-1-GLOBULIN, U: 4 %
ALPHA-2-GLOBULIN, U: 14.9 %
Beta Globulin, U: 21.3 %
Gamma Globulin, U: 9.5 %
Total Protein, Urine: 12 mg/dL

## 2016-12-11 LAB — BASIC METABOLIC PANEL
Anion gap: 4 — ABNORMAL LOW (ref 5–15)
BUN: 49 mg/dL — AB (ref 6–20)
CO2: 20 mmol/L — ABNORMAL LOW (ref 22–32)
CREATININE: 2.31 mg/dL — AB (ref 0.61–1.24)
Calcium: 7.2 mg/dL — ABNORMAL LOW (ref 8.9–10.3)
Chloride: 113 mmol/L — ABNORMAL HIGH (ref 101–111)
GFR calc Af Amer: 28 mL/min — ABNORMAL LOW (ref >60)
GFR, EST NON AFRICAN AMERICAN: 24 mL/min — AB (ref >60)
GLUCOSE: 118 mg/dL — AB (ref 65–99)
POTASSIUM: 4.3 mmol/L (ref 3.5–5.1)
SODIUM: 137 mmol/L (ref 135–145)

## 2016-12-11 LAB — ANA W/REFLEX IF POSITIVE: ANA: NEGATIVE

## 2016-12-11 MED ORDER — ENSURE ENLIVE PO LIQD: 237.0000 mL | Freq: Three times a day (TID) | ORAL | 12 refills | Status: AC

## 2016-12-11 MED ORDER — FUROSEMIDE 20 MG PO TABS
20.0000 mg | ORAL_TABLET | Freq: Every day | ORAL | Status: DC
  Administered 2016-12-11: 20 mg via ORAL
  Filled 2016-12-11: qty 1

## 2016-12-11 NOTE — Progress Notes (Signed)
Physical Therapy Treatment Patient Details Name: Aaron Barnett MRN: [id number] DOB: [birth year] Today's Date: 12/11/2016    History of Present Illness 83 y.o. male with a known history of congestive heart failure, diabetes type 2, essential hypertension who was sent by his primary care 69 office for anemia as well as acute renal failure.  Pt to have a unit of RBC but is asymptomatic and nursing okayed working with PT this AM.    PT Comments    Bed mobility with ease.  Pt was able to ambulate around nursing unit x 1 with [facility] and min guard.  Pt with imbalances at times but was able to correct without assist.  Pt stated he has a RW at home but does not use it.  Discussed with pt and he feels comfortable with [facility] at this time.    Pt was receiving PT services through [facility] and recommend continuing them upon discharge.   Follow Up Recommendations  Home health PT;Other (comment)     Equipment Recommendations       Recommendations for Other Services       Precautions / Restrictions Precautions Precautions: Fall Restrictions Weight Bearing Restrictions: No    Mobility  Bed Mobility Overal bed mobility: Independent                Transfers Overall transfer level: Independent                  Ambulation/Gait Ambulation/Gait assistance: Supervision Ambulation Distance (Feet): 200 Feet Assistive device: Quad cane Gait Pattern/deviations: Step-through pattern Gait velocity: some unsteadiness noted but able to correct imbalances without assist       Stairs            Wheelchair Mobility    Modified Rankin (Stroke Patients Only)       Balance Overall balance assessment: Modified Independent                                          Cognition Arousal/Alertness: Awake/alert Behavior During Therapy: WFL for tasks assessed/performed Overall Cognitive Status: Within Functional Limits for tasks assessed                                         Exercises      General Comments        Pertinent Vitals/Pain Pain Assessment: No/denies pain    Home Living                      Prior Function            PT Goals (current goals can now be found in the care plan section) Progress towards PT goals: Progressing toward goals    Frequency    Min 2X/week      PT Plan Current plan remains appropriate    Co-evaluation              AM-PAC PT "6 Clicks" Daily Activity  Outcome Measure  Difficulty turning over in bed (including adjusting bedclothes, sheets and blankets)?: None Difficulty moving from lying on back to sitting on the side of the bed? : None Difficulty sitting down on and standing up from a chair with arms (e.g., wheelchair, bedside commode, etc,.)?: None Help needed moving to and from a bed  to chair (including a wheelchair)?: None Help needed walking in hospital room?: A Little Help needed climbing 3-5 steps with a railing? : A Little 6 Click Score: 22    End of Session Equipment Utilized During Treatment: Gait belt Activity Tolerance: Patient tolerated treatment well Patient left: in chair;with call bell/phone within reach;with nursing/sitter in room Nurse Communication: Mobility status;Other (comment)       Time: 6553-7482 PT Time Calculation (min) (ACUTE ONLY): 16 min  Charges:  $Gait Training: 8-22 mins                    G Codes:      Chesley Noon, PTA 12/11/16, 9:24 AM

## 2016-12-11 NOTE — Progress Notes (Signed)
Patient will be discharged to skilled nursing at [facility]. Wife had concerns about making sure facility is aware of medications patient needs to take. Assured wife that nursing staff at [facility] would be notified of any medications and treatments ordered by the providers for him to continue at discharge. Report given to Rubie Maid, RN at [facility].

## 2016-12-11 NOTE — Progress Notes (Signed)
[facility]  ROUNDING NOTE   Subjective:   Wife at bedside.   Creatinine 2.31  Complains of shortness of breath.   Objective:  Vital signs in last 24 hours:  Temp:  [98.1 F (36.7 C)-98.5 F (36.9 C)] 98.5 F (36.9 C) (10/16 0438) Pulse Rate:  [65-77] 75 (10/16 0647) Resp:  [18-24] 18 (10/16 0438) BP: (144-172)/(53-67) 144/53 (10/16 0647) SpO2:  [98 %-99 %] 98 % (10/16 0438) Weight:  [64 kg (141 lb 1.6 oz)-64.9 kg (143 lb 1.6 oz)] 64 kg (141 lb 1.6 oz) (10/16 0500)  Weight change:  Filed Weights   12/07/16 2047 12/10/16 1300 12/11/16 0500  Weight: 62.1 kg (137 lb) 64.9 kg (143 lb 1.6 oz) 64 kg (141 lb 1.6 oz)    Intake/Output: I/O last 3 completed shifts: In: 2721.3 [I.V.:2721.3] Out: 1450 [Urine:1450]   Intake/Output this shift:  Total I/O In: 60 [P.O.:60] Out: 300 [Urine:300]  Physical Exam: General: NAD, sitting in chair  Head: Normocephalic, atraumatic. Moist oral mucosal membranes  Eyes: Anicteric, PERRL  Neck: Supple, trachea midline  Lungs:  Clear to auscultation  Heart: Regular rate and rhythm  Abdomen:  Soft, nontender,   Extremities: no peripheral edema.  Neurologic: Nonfocal, moving all four extremities  Skin: No lesions       Basic Metabolic Panel:  Recent Labs Lab 12/07/16 1233 12/08/16 0358 12/09/16 0319 12/10/16 0304 12/11/16 0332  NA 133* 137 138 138 137  K 4.3 4.0 4.5 4.7 4.3  CL 100* 108 109 112* 113*  CO2 21* 21* 21* 20* 20*  GLUCOSE 105* 77 107* 113* 118*  BUN 68* 63* 62* 58* 49*  CREATININE 3.35* 2.86* 2.82* 2.60* 2.31*  CALCIUM 8.6* 7.7* 7.6* 7.4* 7.2*    Liver Function Tests:  Recent Labs Lab 12/07/16 1233  AST 34  ALT 21  ALKPHOS 23*  BILITOT 0.8  PROT 6.8  ALBUMIN 3.4*    Recent Labs Lab 12/07/16 1233  LIPASE 94*   No results for input(s): AMMONIA in the last 168 hours.  CBC:  Recent Labs Lab 12/07/16 1233 12/08/16 0358 12/09/16 0319 12/10/16 0304  WBC 7.4 6.7  --   --   HGB  8.3* 7.4* 7.0* 8.2*  HCT 24.6* 21.9*  --   --   MCV 86.9 89.7  --   --   PLT 507* 436  --   --     Cardiac Enzymes: No results for input(s): CKTOTAL, CKMB, CKMBINDEX, TROPONINI in the last 168 hours.  BNP: Invalid input(s): POCBNP  CBG:  Recent Labs Lab 12/10/16 1128 12/10/16 1748 12/10/16 2222 12/11/16 0743 12/11/16 1137  GLUCAP 127* 108* 122* 101* 146*    Microbiology: No results found for this or any previous visit.  Coagulation Studies: No results for input(s): LABPROT, INR in the last 72 hours.  Urinalysis: No results for input(s): COLORURINE, LABSPEC, PHURINE, GLUCOSEU, HGBUR, BILIRUBINUR, KETONESUR, PROTEINUR, UROBILINOGEN, NITRITE, LEUKOCYTESUR in the last 72 hours.  Invalid input(s): APPERANCEUR    Imaging: No results found.   Medications:    . aspirin EC  325 mg Oral Daily  . carvedilol  6.25 mg Oral BID WC  . docusate sodium  200 mg Oral BID  . feeding supplement (ENSURE ENLIVE)  237 mL Oral TID BM  . furosemide  20 mg Oral Daily  . heparin  5,000 Units Subcutaneous Q8H  . Influenza vac split quadrivalent PF  0.5 mL Intramuscular Tomorrow-1000  . insulin aspart  0-5 Units Subcutaneous QHS  .  insulin aspart  0-9 Units Subcutaneous TID WC  . senna  1 tablet Oral Daily   acetaminophen **OR** acetaminophen, albuterol, alum & mag hydroxide-simeth, bisacodyl, ondansetron **OR** ondansetron (ZOFRAN) IV  Assessment/ Plan:  [name] is a 83 y.o. white male withcongestive heart failure and history of cardiomyopathy with biventricular ICD in place, diabetes mellitus type 2, hypertension, prostate cancer  1. Acute renal failure on chronic kidney disease stage III Baseline creatinine 1.7, GFR of 42 Acute renal failure secondary to anemia and prerenal azotemia. Improving with IV fluids Chronic kidney disease secondary to diabetes and hypertension. Bland urine on admission - Restart furosemide 20mg  daily.   2. Anemia with renal failure: status post  PRBC transfusion - Appreciate GI input.   3. Diabetes mellitus type II with chronic kidney disease: holding metformin  4. Hypertension: blood pressure at goal.  - carvedilol - restart furosemide  Outpatient follow up with Dr. Holley Raring on 10/25 at 11am.    LOS: [place], [place] 10/16/201812:13 PM

## 2016-12-11 NOTE — Discharge Summary (Signed)
[facility] [place] NAME: Aaron Barnett    MR#:  [id number]  DATE OF BIRTH:  1927-05-17  DATE OF ADMISSION:  12/07/2016 ADMITTING PHYSICIAN: Dustin Flock, MD  DATE OF DISCHARGE: 12/11/16  PRIMARY CARE PHYSICIAN: Tracie Harrier, MD    ADMISSION DIAGNOSIS:  AKI (acute kidney injury) ([place]) [N17.9] Left upper quadrant abdominal pain of unknown etiology [R10.12]  DISCHARGE DIAGNOSIS:  Acute on CRF suspected dehydration and overdiuresis Acute on Chronic anemia--no overt bleeding HTN  SECONDARY DIAGNOSIS:   Past Medical History:  Diagnosis Date  . CHF (congestive heart failure) ([place])   . Diabetes mellitus without complication ([place])   . HTN (hypertension)     HOSPITAL COURSE:  83 year old sent from primary care's office for worsening anemia and renal failure  1. Acute renal failure on chronic kidney disease stage II/IV Hold Lasix -  received IV fluids Baseline creatinine around 1.7-1.9 -Came in with creatinine of 3.35--- 2.82--2.60--2.3 -Nephrology consultation appreciated. D/c lasix and f/u Dr Holley Raring next week  2. Anemia Of chronic kidney disease Hb down to 7.0.--- One unit blood transfusion --8.2 No active bleeding.  - Negative stool occult. -Patient is not keen on getting GI workup in the form of endoscopies done. He wants to hold it off as he is not actively bleeding. -?consider hematology consult---defer to PCP  3. Diabetes type 2 startedsliding scale insulin and discontinuedmetformin BG stable  4. History of congestive heart failure. Stable. Hold Lasix  Continue Coreg.  5. Discharge planning [facility] is from [facility] in dependent living and will discharge with home health PT.  6. Pt had mild episode of difficulty swallowing. No s/o aspiration. Tolerated FLD well--advanced to mechanical soft diet and have ST see before d/c.   D/c to [facility] independent living later today and resume home health  PT CONSULTS OBTAINED:  Treatment Team:  Anthonette Legato, MD  DRUG ALLERGIES:   Allergies  Allergen Reactions  . Doxycycline Rash  . Lisinopril Rash  . Penicillins Rash    Has patient had a PCN reaction causing immediate rash, facial/tongue/throat swelling, SOB or lightheadedness with hypotension: Yes Has patient had a PCN reaction causing severe rash involving mucus membranes or skin necrosis: No Has patient had a PCN reaction that required hospitalization: No Has patient had a PCN reaction occurring within the last 10 years: No If all of the above answers are "NO", then may proceed with Cephalosporin use.    DISCHARGE MEDICATIONS:   Current Discharge Medication List    START taking these medications   Details  feeding supplement, ENSURE ENLIVE, (ENSURE ENLIVE) LIQD Take 237 mLs by mouth 3 (three) times daily between meals. Qty: 237 mL, Refills: 12      CONTINUE these medications which have NOT CHANGED   Details  amLODipine (NORVASC) 2.5 MG tablet Take 2.5 mg by mouth daily.    aspirin 325 MG tablet Take 325 mg by mouth daily.    Biotin 10 MG TABS Take 1 tablet by mouth daily.    carvedilol (COREG) 6.25 MG tablet Take 6.25 mg by mouth 2 (two) times daily with a meal.    fenofibrate (TRICOR) 145 MG tablet Take 145 mg by mouth daily.    Multiple Vitamin (MULTIVITAMIN) capsule Take 1 capsule by mouth daily.    nitroGLYCERIN (NITROSTAT) 0.4 MG SL tablet Place 1 tablet under the tongue every 5 (five) minutes as needed.    vitamin E 400 UNIT capsule Take 1 capsule by  mouth daily.    hydrocortisone 2.5 % cream Apply 1 application topically daily as needed.      STOP taking these medications     metFORMIN (GLUCOPHAGE-XR) 500 MG 24 hr tablet         If you experience worsening of your admission symptoms, develop shortness of breath, life threatening emergency, suicidal or homicidal thoughts you must seek medical attention immediately by calling 911 or calling your MD  immediately  if symptoms less severe.  You Must read complete instructions/literature along with all the possible adverse reactions/side effects for all the Medicines you take and that have been prescribed to you. Take any new Medicines after you have completely understood and accept all the possible adverse reactions/side effects.   Please note  You were cared for by a hospitalist during your hospital stay. If you have any questions about your discharge medications or the care you received while you were in the hospital after you are discharged, you can call the unit and asked to speak with the hospitalist on call if the hospitalist that took care of you is not available. Once you are discharged, your primary care physician will handle any further medical issues. Please note that NO REFILLS for any discharge medications will be authorized once you are discharged, as it is imperative that you return to your primary care physician (or establish a relationship with a primary care physician if you do not have one) for your aftercare needs so that they can reassess your need for medications and monitor your lab values. Today   SUBJECTIVE   Doing well  VITAL SIGNS:  Blood pressure (!) 144/53, pulse 75, temperature 98.5 F (36.9 C), temperature source Oral, resp. rate 18, height 5\' 10"  (1.778 m), weight 64 kg (141 lb 1.6 oz), SpO2 98 %.  I/O:   Intake/Output Summary (Last 24 hours) at 12/11/16 0904 Last data filed at 12/11/16 0825  Gross per 24 hour  Intake          1805.25 ml  Output             1175 ml  Net           630.25 ml    PHYSICAL EXAMINATION:  GENERAL:  83 y.o.-year-old patient lying in the bed with no acute distress.  EYES: Pupils equal, round, reactive to light and accommodation. No scleral icterus. Extraocular muscles intact.  HEENT: Head atraumatic, normocephalic. Oropharynx and nasopharynx clear.  NECK:  Supple, no jugular venous distention. No thyroid enlargement, no  tenderness.  LUNGS: Normal breath sounds bilaterally, no wheezing, rales,rhonchi or crepitation. No use of accessory muscles of respiration.  CARDIOVASCULAR: S1, S2 normal. No murmurs, rubs, or gallops.  ABDOMEN: Soft, non-tender, non-distended. Bowel sounds present. No organomegaly or mass.  EXTREMITIES: No pedal edema, cyanosis, or clubbing.  NEUROLOGIC: Cranial nerves II through XII are intact. Muscle strength 5/5 in all extremities. Sensation intact. Gait not checked.  PSYCHIATRIC: The patient is alert and oriented x 3.  SKIN: No obvious rash, lesion, or ulcer.   DATA REVIEW:   CBC   Recent Labs Lab 12/08/16 0358  12/10/16 0304  WBC 6.7  --   --   HGB 7.4*  < > 8.2*  HCT 21.9*  --   --   PLT 436  --   --   < > = values in this interval not displayed.  Chemistries   Recent Labs Lab 12/07/16 1233  12/11/16 0332  NA 133*  < >  137  K 4.3  < > 4.3  CL 100*  < > 113*  CO2 21*  < > 20*  GLUCOSE 105*  < > 118*  BUN 68*  < > 49*  CREATININE 3.35*  < > 2.31*  CALCIUM 8.6*  < > 7.2*  AST 34  --   --   ALT 21  --   --   ALKPHOS 23*  --   --   BILITOT 0.8  --   --   < > = values in this interval not displayed.  Microbiology Results   No results found for this or any previous visit (from the past 240 hour(s)).  RADIOLOGY:  No results found.   Management plans discussed with the patient, family and they are in agreement.  CODE STATUS:     Code Status Orders        Start     Ordered   12/07/16 2031  Full code  Continuous     12/07/16 2030    Code Status History    Date Active Date Inactive Code Status Order ID Comments User Context   This patient has a current code status but no historical code status.    Advance Directive Documentation     Most Recent Value  Type of Advance Directive  Healthcare Power of Attorney, Living will  Pre-existing out of facility DNR order (yellow form or pink MOST form)  -  "MOST" Form in Place?  -      TOTAL TIME TAKING CARE  OF THIS PATIENT: 40 minutes.    Gemini Beaumier M.D on 12/11/2016 at 9:04 AM  Between 7am to 6pm - Pager - [phone] After 6pm go to [email or web address] - password [email or web address]  Office  [phone]  CC: Primary care physician; Tracie Harrier, MD

## 2016-12-11 NOTE — Progress Notes (Signed)
  Speech Language Pathology Treatment: Dysphagia  Patient Details Name: Aaron Barnett MRN: [id number] DOB: 1927-05-21 Today's Date: 12/11/2016 Time: 1000-1035 SLP Time Calculation (min) (ACUTE ONLY): 35 min  Assessment / Plan / Recommendation Clinical Impression  Pt seen for f/u w/ toleration of diet and education on food options/preparation d/t c/o Esophageal dysmotility. Pt eating some of his breakfast meal(late tray); family present. MD consulted prior to session re: pt's c/o Esophageal dysmotility and need for f/u w/ GI for further motility assessment as pt c/o s/s of dysmotility.  Pt consumed trials of thin liquids, purees and soft textured, moist foods(peaches) w/ min increased oral phase time for masticating the increased textured foods - pt also placed his dentures for masticating such. Given time, pt was able to adequately clear the food and liquid trials w/ no overt s/s of aspiration noted; no immediate c/o Esophageal dysmotility. Pt did not take many po trials as he had recently finished most of his late tray. Pt feeds himself.  Thorough education given to pt and family on Esophageal phase dysmotility and its impact on swallowing as well as the increased risk for aspiration of any regurgitated, refluxed material from the stomach/esophagus. Thorough education on general aspiration precautions; education and handouts given on REFLUX information and suggestions for modifying and moistening foods; food optionns; eating behaviors. Encouraged pt to wear his dentures when eating solid foods for more efficient mastication. Strongly recommended f/u w/ GI services for further assessment of Esophageal dysmotility; management.  NSG to reconsult if any further education needs or decline in status while admitted.    HPI HPI: 83 year old male admitted 12/07/16 with anemia and acute renal failure. PMH significant for CHF, DM, HTN. BSE ordered after pt choked on eggs this morning. Pt and family member endorses  s/s of Esophageal dysmotility including a "fullness" in his chest; pt stated he used to take TUMS frequently. His c/o dysmotility are heightened w/ meats and breads.       SLP Plan  All goals met       Recommendations  Diet recommendations: Dysphagia 3 (mechanical soft);Thin liquid (w/ level 2 and 1 (purees) added in until sees a GI) Liquids provided via: Cup;Straw (monitor any straw use) Medication Administration: Whole meds with puree (if needed for easier swallowing; otherwise w/ liquids) Supervision: Patient able to self feed;Intermittent supervision to cue for compensatory strategies Compensations: Minimize environmental distractions;Slow rate;Small sips/bites;Multiple dry swallows after each bite/sip;Follow solids with liquid (rest breaks to avoid SOB/WOB) Postural Changes and/or Swallow Maneuvers: Seated upright 90 degrees;Upright 30-60 min after meal                General recommendations:  (GI consult for Esophageal dysmotility; Dietitian f/u) Oral Care Recommendations: Oral care BID;Patient independent with oral care;Staff/trained caregiver to provide oral care Follow up Recommendations: None SLP Visit Diagnosis: Dysphagia, pharyngoesophageal phase (R13.14) Plan: All goals met       GO                [name], MS, CCC-SLP Emmagene Ortner 12/11/2016, 1:45 PM

## 2016-12-11 NOTE — Clinical Social Work Note (Signed)
Clinical Social Work Assessment  Patient Details  Name: Aaron Barnett MRN: [id number] Date of Birth: Nov 17, 1927  Date of referral:  12/11/16               Reason for consult:  Facility Placement                Permission sought to share information with:    Permission granted to share information::     Name::        Agency::     Relationship::     Contact Information:     Housing/Transportation Living arrangements for the past 2 months:    Source of Information:  Facility, Spouse Patient Interpreter Needed:  None Criminal Activity/Legal Involvement Pertinent to Current Situation/Hospitalization:  No - Comment as needed Significant Relationships:  Spouse Lives with:  Spouse Do you feel safe going back to the place where you live?  Yes Need for family participation in patient care:  Yes (Comment)  Care giving concerns:  Patient is a resident at [facility] with his spouse.   Social Worker assessment / plan:  Patient's wife informed RN CM on day of discharge that she wanted patient to use his respite days at [facility] skilled. CSW contacted Seth Bake at [facility] and had her make arrangements with patient and his wife. They decided to bring him in under medicare and go ahead and treat it as though it was a skilled admission. CSW provided FL2 and pasrr and sent discharge summary. Seth Bake informed patient's wife and CSW that they will transport and pick patient up about 4pm.  Employment status:  Retired Forensic scientist:  Medicare PT Recommendations:  Home with [place] / Referral to community resources:     Patient/Family's Response to care:  Patient's wife made arrangements with Seth Bake.  Patient/Family's Understanding of and Emotional Response to Diagnosis, Current Treatment, and Prognosis:  Wife expressed relief to Seth Bake regarding being able to have patient go to skilled and patient was in agreement.  Emotional Assessment Appearance:     Attitude/Demeanor/Rapport:    Affect (typically observed):  Accepting Orientation:  Oriented to Self, Oriented to Place Alcohol / Substance use:  Not Applicable Psych involvement (Current and /or in the community):  No (Comment)  Discharge Needs  Concerns to be addressed:    Readmission within the last 30 days:  No Current discharge risk:  None Barriers to Discharge:  No Barriers Identified   Shela Leff, LCSW 12/11/2016, 4:26 PM

## 2016-12-11 NOTE — NC FL2 (Signed)
[place] LEVEL OF CARE SCREENING TOOL     IDENTIFICATION  Patient Name: Aaron Barnett Birthdate: October 16, 1927 Sex: male Admission Date (Current Location): 12/07/2016  [facility] and [place] Number:  Engineering geologist and Address:  [facility], [address], [place], [place] [address]      Provider Number: [id number]  Attending Physician Name and Address:  Fritzi Mandes, MD  Relative Name and Phone Number:       Current Level of Care: Hospital Recommended Level of Care: [place] Prior Approval Number:    Date Approved/Denied:   PASRR Number:    Discharge Plan: SNF    Current Diagnoses: Patient Active Problem List   Diagnosis Date Noted  . ARF (acute renal failure) ([place]) 12/07/2016    Orientation RESPIRATION BLADDER Height & Weight     Self, Situation, Place  Normal Continent Weight: 141 lb 1.6 oz (64 kg) Height:  5\' 10"  (177.8 cm)  BEHAVIORAL SYMPTOMS/MOOD NEUROLOGICAL BOWEL NUTRITION STATUS   (none)  (none) Continent Diet (dysphagia 3)  AMBULATORY STATUS COMMUNICATION OF NEEDS Skin   Limited Assist Verbally                         Personal Care Assistance Level of Assistance  Bathing, Dressing Bathing Assistance: Limited assistance Feeding assistance: Limited assistance Dressing Assistance: Limited assistance     Functional Limitations Info             SPECIAL CARE FACTORS FREQUENCY  PT (By licensed PT)                    Contractures Contractures Info: Not present    Additional Factors Info  Code Status Code Status Info: full             Current Medications (12/11/2016):  This is the current hospital active medication list Current Facility-Administered Medications  Medication Dose Route Frequency Provider Last Rate Last Dose  . acetaminophen (TYLENOL) tablet 650 mg  650 mg Oral Q6H PRN Dustin Flock, MD   650 mg at 12/11/16 0000   Or  . acetaminophen (TYLENOL) suppository  650 mg  650 mg Rectal Q6H PRN Dustin Flock, MD      . albuterol (PROVENTIL) (2.5 MG/3ML) 0.083% nebulizer solution 2.5 mg  2.5 mg Nebulization Q6H PRN Saundra Shelling, MD   2.5 mg at 12/10/16 2131  . alum & mag hydroxide-simeth (MAALOX/MYLANTA) 200-200-20 MG/5ML suspension 30 mL  30 mL Oral Q4H PRN Dustin Flock, MD   30 mL at 12/10/16 0111  . aspirin EC tablet 325 mg  325 mg Oral Daily Dustin Flock, MD   325 mg at 12/11/16 1134  . bisacodyl (DULCOLAX) EC tablet 5 mg  5 mg Oral Daily PRN Demetrios Loll, MD      . carvedilol (COREG) tablet 6.25 mg  6.25 mg Oral BID WC Dustin Flock, MD   6.25 mg at 12/11/16 0850  . docusate sodium (COLACE) capsule 200 mg  200 mg Oral BID Dustin Flock, MD   200 mg at 12/11/16 1134  . feeding supplement (ENSURE ENLIVE) (ENSURE ENLIVE) liquid 237 mL  237 mL Oral TID BM Demetrios Loll, MD   237 mL at 12/11/16 1135  . furosemide (LASIX) tablet 20 mg  20 mg Oral Daily Kolluru, Sarath, MD      . heparin injection 5,000 Units  5,000 Units Subcutaneous Q8H Dustin Flock, MD   5,000 Units at 12/11/16 0514  .  Influenza vac split quadrivalent PF (FLUZONE HIGH-DOSE) injection 0.5 mL  0.5 mL Intramuscular Tomorrow-1000 Dustin Flock, MD      . insulin aspart (novoLOG) injection 0-5 Units  0-5 Units Subcutaneous QHS Demetrios Loll, MD      . insulin aspart (novoLOG) injection 0-9 Units  0-9 Units Subcutaneous TID WC Demetrios Loll, MD      . ondansetron [facility]) tablet 4 mg  4 mg Oral Q6H PRN Dustin Flock, MD       Or  . ondansetron [facility]) injection 4 mg  4 mg Intravenous Q6H PRN Dustin Flock, MD   4 mg at 12/09/16 1254  . senna (SENOKOT) tablet 8.6 mg  1 tablet Oral Daily Dustin Flock, MD   8.6 mg at 12/11/16 1134     Discharge Medications: Please see discharge summary for a list of discharge medications.  Relevant Imaging Results:  Relevant Lab Results:   Additional Information    Shela Leff, LCSW

## 2016-12-11 NOTE — Clinical Social Work Placement (Signed)
   CLINICAL SOCIAL WORK PLACEMENT  NOTE  Date:  12/11/2016  Patient Details  Name: Aaron Barnett MRN: [id number] Date of Birth: 28-Oct-1927  Clinical Social Work is seeking post-discharge placement for this patient at the [place] level of care (*CSW will initial, date and re-position this form in  chart as items are completed):  Yes   Patient/family provided with [place] Work Department's list of facilities offering this level of care within the geographic area requested by the patient (or if unable, by the patient's family).   (n/a)   Patient/family informed of their freedom to choose among providers that offer the needed level of care, that participate in Medicare, Medicaid or managed care program needed by the patient, have an available bed and are willing to accept the patient.   (n/a)   Patient/family informed of [facility]'s ownership interest in [facility] and [facility], as well as of the fact that they are under no obligation to receive care at these facilities.  PASRR submitted to EDS on 12/11/16     PASRR number received on 12/11/16     Existing PASRR number confirmed on       FL2 transmitted to all facilities in geographic area requested by pt/family on  (N/A)     FL2 transmitted to all facilities within larger geographic area on       Patient informed that his/her managed care company has contracts with or will negotiate with certain facilities, including the following:         (n/a)   Patient/family informed of bed offers received.  Patient chooses bed at  [facility] as he is a campus resident)     Physician recommends and patient chooses bed at      Patient to be transferred to  [facility]) on 12/11/16.  Patient to be transferred to facility by  [facility] transportation)     Patient family notified on 12/11/16 of transfer.  Name of family member notified:   (wife)     PHYSICIAN       Additional Comment:     _______________________________________________ Shela Leff, LCSW 12/11/2016, 4:32 PM

## 2016-12-11 NOTE — Discharge Instructions (Signed)
folllow recommendations per speech therapy

## 2016-12-11 NOTE — Care Management (Signed)
Patient to discharge today.  Patient lives at independent living at [facility] with his wife.  PT has has recommending for patient to continue PT services that he is receiving through [facility].  MD wrote for home health RN and PT.  RNCM presented options for home health.  Patient's wife requests that patient utilize his respite days at the skilled side of [facility].  CSW notified, and is facilitating discharge.  RNCM signing off.

## 2016-12-11 NOTE — Care Management Important Message (Signed)
Important Message  Patient Details  Name: Aaron Barnett MRN: [id number] Date of Birth: Dec 21, 1927   Medicare Important Message Given:  Yes    Beverly Sessions, RN 12/11/2016, 12:04 PM

## 2016-12-12 ENCOUNTER — Telehealth: Payer: Self-pay

## 2016-12-12 NOTE — Telephone Encounter (Signed)
He went home instead of staying in rehab This is fine Should have follow up with his PCP then

## 2016-12-12 NOTE — Telephone Encounter (Signed)
PLEASE NOTE: All timestamps contained within this report are represented as [place] Standard Time. CONFIDENTIALTY NOTICE: This fax transmission is intended only for the addressee. It contains information that is legally privileged, confidential or otherwise protected from use or disclosure. If you are not the intended recipient, you are strictly prohibited from reviewing, disclosing, copying using or disseminating any of this information or taking any action in reliance on or regarding this information. If you have received this fax in error, please notify us immediately by telephone so that we can arrange for its return to [place]. Phone: [phone], Toll-Free: [phone], Fax: [phone] Page: 1 of 1 Call Id: [id number] [place] Day - Client Nonclinical Telephone Record [place] Day - Client Client Site [place] - Day Contact Type Call Who Is Calling Physician / Provider / Hospital Call Type Provider Call Message Only Reason for Call Request to send message to Office Initial Comment Caller states she needs to leave a message. Additional Comment Caller states that she is with [facility] and a resident wants to leave AMA. Patient is Aaron Barnett (1927/12/30) He wants to leave against medical advice. [phone] Call Closed By: Loyal Jacobson Transaction Date/Time: 12/11/2016 5:17:20 PM (ET)

## 2016-12-12 NOTE — Telephone Encounter (Signed)
pts name is [employer]. FYI to Dr Silvio Pate.

## 2016-12-17 NOTE — Progress Notes (Signed)
Hematology/Oncology Consult note [facility] Telephone:([phone] [phone]  Patient Care Team: Tracie Harrier, MD as PCP - General (Internal Medicine)   Name of the patient: Aaron Barnett  [id number]  January 15, 1928    Reason for referral- anemia   Referring physician- Dr. Ginette Pitman  Date of visit: 12/17/16   History of presenting illness- patient is a 83 year old male with a past medical history  Significant for hypertension, hyperlipidemia, diabetes and acute kidney injury was been referred to [place] for evaluation of anemia. Recent CBC from 12/07/2016 showed white count of 7.8, H&H of 7.6/21.9 and a platelet count of 423. Prior to this his hemoglobin has been around 10 between 2016 and June 2018.he does have some chronic kidney disease and his creatinine at baseline is around 1.7 but on 12/07/2016 his creatinine was elevated at 3.2. He is 8 was normal at 1.92 and ferritin was normal at 262.with these findings patient was admitted to the hospital and he was treated for acute kidney failure with IV fluid and his creatinine came down to 2.3. Ferritin done during that time was 321. Iron studies showed a normal TIBC of 298 and a low iron saturation of 17%. B12 levels were elevated at 1198. Folate level was normal at 21. Reticulocyte count was low normal at 1.3%. SPEP did not reveal any evidence of monoclonal protein. TSH was normal at 1.99. Urinalysis did not reveal any evidence of hematuria  He saw Dr. Juleen China as an outpatient for his AKI on CKD. For his hemoglobin of 7 he received 1 unit of blood transfusion. There was no evidence of active bleeding and patient declined GI workup. Currently he reports feeling fatigued. Reports intermittent abdominal pain that travels across his abdominal wall and localizes on his left side. Denies any consistent use of NSAIDS. Denies any blood loss in stools  ECOG PS- 2  Pain scale- 3   Review of systems- Review of Systems    Constitutional: Positive for malaise/fatigue. Negative for chills, fever and weight loss.  HENT: Negative for congestion, ear discharge and nosebleeds.   Eyes: Negative for blurred vision.  Respiratory: Negative for cough, hemoptysis, sputum production, shortness of breath and wheezing.   Cardiovascular: Negative for chest pain, palpitations, orthopnea and claudication.  Gastrointestinal: Positive for abdominal pain. Negative for blood in stool, constipation, diarrhea, heartburn, melena, nausea and vomiting.  Genitourinary: Negative for dysuria, flank pain, frequency, hematuria and urgency.  Musculoskeletal: Negative for back pain, joint pain and myalgias.  Skin: Negative for rash.  Neurological: Negative for dizziness, tingling, focal weakness, seizures, weakness and headaches.  Endo/Heme/Allergies: Does not bruise/bleed easily.  Psychiatric/Behavioral: Negative for depression and suicidal ideas. The patient does not have insomnia.     Allergies  Allergen Reactions  . Doxycycline Rash  . Lisinopril Rash  . Penicillins Rash    Has patient had a PCN reaction causing immediate rash, facial/tongue/throat swelling, SOB or lightheadedness with hypotension: Yes Has patient had a PCN reaction causing severe rash involving mucus membranes or skin necrosis: No Has patient had a PCN reaction that required hospitalization: No Has patient had a PCN reaction occurring within the last 10 years: No If all of the above answers are "NO", then may proceed with Cephalosporin use.    Patient Active Problem List   Diagnosis Date Noted  . ARF (acute renal failure) ([place]) 12/07/2016  . Aortic atherosclerosis ([place]) 04/29/2015  . CKD (chronic kidney disease) stage 3, GFR 30-59 ml/min ([facility]) 04/29/2015  . Benign essential hypertension  04/29/2015  . ARMD (age related macular degeneration) 12/13/2014  . Controlled type 2 diabetes mellitus without complication, without long-term current use of insulin ([place])  12/13/2014  . Biventricular ICD (implantable cardioverter-defibrillator) in place 12/07/2014  . Dilated cardiomyopathy ([place]) 12/07/2014  . Mixed hyperlipidemia 12/07/2014     Past Medical History:  Diagnosis Date  . CHF (congestive heart failure) ([place])   . Diabetes mellitus without complication ([place])   . HTN (hypertension)   . Prostate cancer ([place])   . Skin cancer      Past Surgical History:  Procedure Laterality Date  . PROSTATECTOMY      Social History   Social History  . Marital status: Married    Spouse name: N/A  . Number of children: N/A  . Years of education: N/A   Occupational History  . Not on file.   Social History Main Topics  . Smoking status: Former Smoker    Types: Cigarettes  . Smokeless tobacco: Never Used  . Alcohol use Yes  . Drug use: No  . Sexual activity: Not on file   Other Topics Concern  . Not on file   Social History Narrative  . No narrative on file     Family History  Problem Relation Age of Onset  . Hypertension Mother      Current Outpatient Prescriptions:  .  amLODipine (NORVASC) 2.5 MG tablet, Take 2.5 mg by mouth daily., Disp: , Rfl:  .  aspirin 325 MG tablet, Take 325 mg by mouth daily., Disp: , Rfl:  .  carvedilol (COREG) 6.25 MG tablet, Take 6.25 mg by mouth 2 (two) times daily with a meal., Disp: , Rfl:  .  Cholecalciferol (VITAMIN D3) 2000 units TABS, Take 1 tablet by mouth daily., Disp: , Rfl:  .  feeding supplement, ENSURE ENLIVE, (ENSURE ENLIVE) LIQD, Take 237 mLs by mouth 3 (three) times daily between meals., Disp: 237 mL, Rfl: 12 .  fenofibrate (TRICOR) 145 MG tablet, Take 145 mg by mouth daily., Disp: , Rfl:  .  furosemide (LASIX) 20 MG tablet, Take 20 mg by mouth daily., Disp: , Rfl:  .  Multiple Vitamin (MULTIVITAMIN) capsule, Take 1 capsule by mouth daily., Disp: , Rfl:  .  Multiple Vitamins-Minerals (PRESERVISION AREDS 2+MULTI VIT PO), Take 1 capsule by mouth 2 (two) times daily., Disp: , Rfl:  .   nitroGLYCERIN (NITROSTAT) 0.4 MG SL tablet, Place 1 tablet under the tongue every 5 (five) minutes as needed., Disp: , Rfl:  .  vitamin E 400 UNIT capsule, Take 1 capsule by mouth daily., Disp: , Rfl:    Physical exam:  Vitals:   12/18/16 1047  BP: 114/66  Pulse: 62  Resp: 14  Temp: (!) 96.3 F (35.7 C)  TempSrc: Tympanic  Weight: 140 lb (63.5 kg)   Physical Exam  Constitutional: He is oriented to person, place, and time.  Elderly frail gentleman in no acute distress  HENT:  Head: Normocephalic and atraumatic.  Eyes: Pupils are equal, round, and reactive to light. EOM are normal.  Neck: Normal range of motion.  Cardiovascular: Normal rate, regular rhythm and normal heart sounds.   Pulmonary/Chest: Effort normal and breath sounds normal.  Abdominal: Soft. Bowel sounds are normal.  Neurological: He is alert and oriented to person, place, and time.  Skin: Skin is warm and dry.       CMP Latest Ref Rng & Units 12/11/2016  Glucose 65 - 99 mg/dL 118(H)  BUN 6 - 20 mg/dL 49(H)  Creatinine 0.61 - 1.24 mg/dL 2.31(H)  Sodium 135 - 145 mmol/L 137  Potassium 3.5 - 5.1 mmol/L 4.3  Chloride 101 - 111 mmol/L 113(H)  CO2 22 - 32 mmol/L 20(L)  Calcium 8.9 - 10.3 mg/dL 7.2(L)  Total Protein 6.5 - 8.1 g/dL -  Total Bilirubin 0.3 - 1.2 mg/dL -  Alkaline Phos 38 - 126 U/L -  AST 15 - 41 U/L -  ALT 17 - 63 U/L -   CBC Latest Ref Rng & Units 12/10/2016  WBC 3.8 - 10.6 K/uL -  Hemoglobin 13.0 - 18.0 g/dL 8.2(L)  Hematocrit 40.0 - 52.0 % -  Platelets 150 - 440 K/uL -    No images are attached to the encounter.  Ct Renal Stone Study  Result Date: 12/07/2016 CLINICAL DATA:  83 year old male with weakness, generalized pain, anemia, renal failure. EXAM: CT ABDOMEN AND PELVIS WITHOUT CONTRAST TECHNIQUE: Multidetector CT imaging of the abdomen and pelvis was performed following the standard protocol without IV contrast. COMPARISON:  04/18/2015 CT Abdomen and Pelvis FINDINGS: Lower chest:  Partially visible cardiac pacemaker or AICD leads. No pericardial effusion. Mild cardiomegaly. Negative lung bases. No pleural effusion. Hepatobiliary: Small layering cholelithiasis. No pericholecystic inflammation. Negative noncontrast liver ; small chronic calcified granulomas in the left lobe. Pancreas: Within normal limits. Spleen: Negative. Adrenals/Urinary Tract: Normal adrenal glands. Bilateral noncontrast kidneys appears stable and normal for age. No hydroureter. Negative course of both ureters. Small right hemipelvis phleboliths are stable. Unremarkable urinary bladder. Stomach/Bowel: Negative rectum. Redundant sigmoid colon with retained stool but otherwise negative. Retained stool in the left colon. Minimal diverticulae, no active inflammation. Continued retained stool throughout the transverse colon which is otherwise negative. Redundant hepatic flexure and right colon which is mostly decompressed. Elongated but normal appendix. Negative terminal ileum. No dilated small bowel. Negative stomach. The second portion of the duodenum is fluid-filled, but not definitely inflamed or abnormal (series 2, image 28). The remaining duodenum is negative. No abdominal free fluid or free air. Vascular/Lymphatic: Extensive Aortoiliac calcified atherosclerosis. Vascular patency is not evaluated in the absence of IV contrast. No lymphadenopathy. Reproductive: Stable small left fat containing inguinal hernia. Other: No pelvic free fluid. Musculoskeletal: Osteopenia. Diffuse lower thoracic and lumbar spinal compression fractures sparing the L3 level. Prior proximal left femur ORIF. Stable visualized osseous structures. IMPRESSION: 1. No convincing acute or inflammatory process in the noncontrast abdomen or pelvis. 2.  Aortic Atherosclerosis (ICD10-I70.0). 3. Chronic cholelithiasis. 4. Chronic spinal compression fractures. Electronically Signed   By: Genevie Ann M.D.   On: 12/07/2016 17:56    Assessment and plan- Patient is a  83 y.o. male referred for normocytic anemia  Normocytic anemia- patient already had recent iron studies B12 folate, TSH and reticulocyte count and SPEP which were unremarkable. Iron studies showed a mildly low iron saturation of 13% but other anemia labs were unremarkable.   Today I will repeat a CBC, CMP, serum free light chains and haptoglobin. If his hemoglobin is persistently less than 10 I will consider weekly Procrit shots for him as there is no other obvious etiology of anemia admitted other than anemia of chronic kidney disease. I will call the patient with the results of blood work from today and arrange for Procrit accordingly. He will continue to give him weekly hemoglobin and Procrit shots. I discussed risks and benefits of Procrit including all but not limited to risk of heart attacks and strokes is hemoglobin is corrected to greater than 11. We will aim to keep  his hemoglobin around 10 and hold his Procrit if his hemoglobin is more than that. I will see him back in 1 month's time with a repeat CBC and ferritin and iron studies and CMP   Given his low iron saturation I will also consider doing 2 weekly doses of feraheme for him.  Thank you for this kind referral and the opportunity to participate in the care of this patient   Visit Diagnosis 1. Normocytic anemia     Dr. Randa Evens, MD, MPH [facility] at [facility] Pager- [phone] 12/17/2016  12:06 PM

## 2016-12-18 ENCOUNTER — Encounter: Payer: Self-pay | Admitting: Oncology

## 2016-12-18 ENCOUNTER — Inpatient Hospital Stay: Payer: Medicare Other

## 2016-12-18 ENCOUNTER — Inpatient Hospital Stay: Payer: Medicare Other | Attending: Oncology | Admitting: Oncology

## 2016-12-18 ENCOUNTER — Encounter (INDEPENDENT_AMBULATORY_CARE_PROVIDER_SITE_OTHER): Payer: Self-pay

## 2016-12-18 VITALS — BP 114/66 | HR 62 | Temp 96.3°F | Resp 14 | Wt 140.0 lb

## 2016-12-18 DIAGNOSIS — Z7982 Long term (current) use of aspirin: Secondary | ICD-10-CM | POA: Diagnosis not present

## 2016-12-18 DIAGNOSIS — I700 Atherosclerosis of aorta: Secondary | ICD-10-CM | POA: Insufficient documentation

## 2016-12-18 DIAGNOSIS — Z8546 Personal history of malignant neoplasm of prostate: Secondary | ICD-10-CM | POA: Insufficient documentation

## 2016-12-18 DIAGNOSIS — Z8673 Personal history of transient ischemic attack (TIA), and cerebral infarction without residual deficits: Secondary | ICD-10-CM | POA: Insufficient documentation

## 2016-12-18 DIAGNOSIS — Z87891 Personal history of nicotine dependence: Secondary | ICD-10-CM | POA: Diagnosis not present

## 2016-12-18 DIAGNOSIS — I420 Dilated cardiomyopathy: Secondary | ICD-10-CM | POA: Diagnosis not present

## 2016-12-18 DIAGNOSIS — Z85828 Personal history of other malignant neoplasm of skin: Secondary | ICD-10-CM | POA: Insufficient documentation

## 2016-12-18 DIAGNOSIS — I130 Hypertensive heart and chronic kidney disease with heart failure and stage 1 through stage 4 chronic kidney disease, or unspecified chronic kidney disease: Secondary | ICD-10-CM

## 2016-12-18 DIAGNOSIS — Z79899 Other long term (current) drug therapy: Secondary | ICD-10-CM | POA: Insufficient documentation

## 2016-12-18 DIAGNOSIS — E782 Mixed hyperlipidemia: Secondary | ICD-10-CM | POA: Insufficient documentation

## 2016-12-18 DIAGNOSIS — E1122 Type 2 diabetes mellitus with diabetic chronic kidney disease: Secondary | ICD-10-CM | POA: Diagnosis not present

## 2016-12-18 DIAGNOSIS — N183 Chronic kidney disease, stage 3 (moderate): Secondary | ICD-10-CM | POA: Diagnosis not present

## 2016-12-18 DIAGNOSIS — Z95810 Presence of automatic (implantable) cardiac defibrillator: Secondary | ICD-10-CM | POA: Diagnosis not present

## 2016-12-18 DIAGNOSIS — D649 Anemia, unspecified: Secondary | ICD-10-CM

## 2016-12-18 DIAGNOSIS — H3530 Unspecified macular degeneration: Secondary | ICD-10-CM | POA: Insufficient documentation

## 2016-12-18 DIAGNOSIS — D631 Anemia in chronic kidney disease: Secondary | ICD-10-CM | POA: Diagnosis not present

## 2016-12-18 LAB — CBC WITH DIFFERENTIAL/PLATELET
Basophils Absolute: 0.1 10*3/uL (ref 0–0.1)
Basophils Relative: 1 %
EOS ABS: 0.5 10*3/uL (ref 0–0.7)
EOS PCT: 6 %
HCT: 22.5 % — ABNORMAL LOW (ref 40.0–52.0)
Hemoglobin: 7.6 g/dL — ABNORMAL LOW (ref 13.0–18.0)
LYMPHS ABS: 0.8 10*3/uL — AB (ref 1.0–3.6)
Lymphocytes Relative: 10 %
MCH: 30.7 pg (ref 26.0–34.0)
MCHC: 33.8 g/dL (ref 32.0–36.0)
MCV: 90.9 fL (ref 80.0–100.0)
MONOS PCT: 9 %
Monocytes Absolute: 0.8 10*3/uL (ref 0.2–1.0)
Neutro Abs: 6.5 10*3/uL (ref 1.4–6.5)
Neutrophils Relative %: 74 %
Platelets: 487 10*3/uL — ABNORMAL HIGH (ref 150–440)
RBC: 2.48 MIL/uL — ABNORMAL LOW (ref 4.40–5.90)
RDW: 18.7 % — ABNORMAL HIGH (ref 11.5–14.5)
WBC: 8.8 10*3/uL (ref 3.8–10.6)

## 2016-12-18 LAB — COMPREHENSIVE METABOLIC PANEL
ALBUMIN: 3.1 g/dL — AB (ref 3.5–5.0)
ALK PHOS: 40 U/L (ref 38–126)
ALT: 20 U/L (ref 17–63)
AST: 28 U/L (ref 15–41)
Anion gap: 11 (ref 5–15)
BILIRUBIN TOTAL: 0.5 mg/dL (ref 0.3–1.2)
BUN: 48 mg/dL — AB (ref 6–20)
CALCIUM: 8.4 mg/dL — AB (ref 8.9–10.3)
CO2: 22 mmol/L (ref 22–32)
CREATININE: 2.31 mg/dL — AB (ref 0.61–1.24)
Chloride: 103 mmol/L (ref 101–111)
GFR calc Af Amer: 28 mL/min — ABNORMAL LOW (ref >60)
GFR calc non Af Amer: 24 mL/min — ABNORMAL LOW (ref >60)
GLUCOSE: 134 mg/dL — AB (ref 65–99)
Potassium: 4.1 mmol/L (ref 3.5–5.1)
Sodium: 136 mmol/L (ref 135–145)
TOTAL PROTEIN: 6.4 g/dL — AB (ref 6.5–8.1)

## 2016-12-18 NOTE — Progress Notes (Signed)
Patient is here for initial evaluation of anemia. He states that he not having pain at this moment, but he does have left sided abdominal pain in the evenings usually after eating dinner.

## 2016-12-18 NOTE — Addendum Note (Signed)
Addended by: Luella Cook on: 12/18/2016 12:21 PM   Modules accepted: Orders

## 2016-12-19 ENCOUNTER — Telehealth: Payer: Self-pay | Admitting: *Deleted

## 2016-12-19 ENCOUNTER — Other Ambulatory Visit: Payer: Self-pay | Admitting: Oncology

## 2016-12-19 DIAGNOSIS — D631 Anemia in chronic kidney disease: Secondary | ICD-10-CM | POA: Insufficient documentation

## 2016-12-19 DIAGNOSIS — N189 Chronic kidney disease, unspecified: Principal | ICD-10-CM

## 2016-12-19 LAB — KAPPA/LAMBDA LIGHT CHAINS
KAPPA FREE LGHT CHN: 53.8 mg/L — AB (ref 3.3–19.4)
Kappa, lambda light chain ratio: 1.12 (ref 0.26–1.65)
Lambda free light chains: 48 mg/L — ABNORMAL HIGH (ref 5.7–26.3)

## 2016-12-19 LAB — HAPTOGLOBIN: HAPTOGLOBIN: 224 mg/dL — AB (ref 34–200)

## 2016-12-19 NOTE — Telephone Encounter (Signed)
Called pt and spoke to wife and gave her results of the hgb , bun, creat. Explained with the creat and hgb he would benefit from getting procrit and I can set it up when I get approval from [place] who checks insurance.  She is agreeable to the plan and gave me the list of appts for 1 week. I will call her when I get approval and set it up.

## 2016-12-20 ENCOUNTER — Telehealth: Payer: Self-pay | Admitting: *Deleted

## 2016-12-20 LAB — MULTIPLE MYELOMA PANEL, SERUM
Albumin SerPl Elph-Mcnc: 2.9 g/dL (ref 2.9–4.4)
Albumin/Glob SerPl: 1 (ref 0.7–1.7)
Alpha 1: 0.4 g/dL (ref 0.0–0.4)
Alpha2 Glob SerPl Elph-Mcnc: 0.9 g/dL (ref 0.4–1.0)
B-Globulin SerPl Elph-Mcnc: 1.1 g/dL (ref 0.7–1.3)
Gamma Glob SerPl Elph-Mcnc: 0.7 g/dL (ref 0.4–1.8)
Globulin, Total: 3 g/dL (ref 2.2–3.9)
IgA: 244 mg/dL (ref 61–437)
IgG (Immunoglobin G), Serum: 780 mg/dL (ref 700–1600)
IgM (Immunoglobulin M), Srm: 40 mg/dL (ref 15–143)
Total Protein ELP: 5.9 g/dL — ABNORMAL LOW (ref 6.0–8.5)

## 2016-12-20 NOTE — Telephone Encounter (Signed)
Called pt's house and left message that I had made an appt for pt to come and get injection tom 10/26 at 3 pm. I did look at her schedule she left me. Patient has appt with Hande at 11 am and then fath at 2 pm so I felt that 3 pm would be a good time and allow them to finish with Fath.  She can call me with any questions and the appt is in the computer.

## 2016-12-21 ENCOUNTER — Inpatient Hospital Stay: Payer: Medicare Other

## 2016-12-21 ENCOUNTER — Other Ambulatory Visit: Payer: Self-pay | Admitting: *Deleted

## 2016-12-21 VITALS — BP 116/64

## 2016-12-21 DIAGNOSIS — N189 Chronic kidney disease, unspecified: Principal | ICD-10-CM

## 2016-12-21 DIAGNOSIS — D631 Anemia in chronic kidney disease: Secondary | ICD-10-CM

## 2016-12-21 MED ORDER — EPOETIN ALFA 10000 UNIT/ML IJ SOLN
10000.0000 [IU] | INTRAMUSCULAR | Status: DC
  Administered 2016-12-21: 10000 [IU] via SUBCUTANEOUS

## 2016-12-24 ENCOUNTER — Other Ambulatory Visit: Payer: Self-pay | Admitting: *Deleted

## 2016-12-24 ENCOUNTER — Inpatient Hospital Stay
Admission: EM | Admit: 2016-12-24 | Discharge: 2017-01-26 | DRG: 378 | Disposition: E | Payer: Medicare Other | Attending: Internal Medicine | Admitting: Internal Medicine

## 2016-12-24 ENCOUNTER — Telehealth: Payer: Self-pay | Admitting: *Deleted

## 2016-12-24 ENCOUNTER — Inpatient Hospital Stay: Payer: Medicare Other

## 2016-12-24 ENCOUNTER — Other Ambulatory Visit: Payer: Self-pay | Admitting: Oncology

## 2016-12-24 ENCOUNTER — Encounter: Payer: Self-pay | Admitting: *Deleted

## 2016-12-24 ENCOUNTER — Other Ambulatory Visit: Payer: Self-pay

## 2016-12-24 DIAGNOSIS — E119 Type 2 diabetes mellitus without complications: Secondary | ICD-10-CM | POA: Diagnosis not present

## 2016-12-24 DIAGNOSIS — I462 Cardiac arrest due to underlying cardiac condition: Secondary | ICD-10-CM | POA: Diagnosis not present

## 2016-12-24 DIAGNOSIS — E861 Hypovolemia: Secondary | ICD-10-CM | POA: Diagnosis not present

## 2016-12-24 DIAGNOSIS — R350 Frequency of micturition: Secondary | ICD-10-CM | POA: Diagnosis present

## 2016-12-24 DIAGNOSIS — Z87891 Personal history of nicotine dependence: Secondary | ICD-10-CM | POA: Diagnosis not present

## 2016-12-24 DIAGNOSIS — E860 Dehydration: Secondary | ICD-10-CM | POA: Diagnosis present

## 2016-12-24 DIAGNOSIS — I5022 Chronic systolic (congestive) heart failure: Secondary | ICD-10-CM | POA: Diagnosis present

## 2016-12-24 DIAGNOSIS — I959 Hypotension, unspecified: Secondary | ICD-10-CM | POA: Diagnosis not present

## 2016-12-24 DIAGNOSIS — I420 Dilated cardiomyopathy: Secondary | ICD-10-CM | POA: Diagnosis present

## 2016-12-24 DIAGNOSIS — R195 Other fecal abnormalities: Secondary | ICD-10-CM | POA: Diagnosis not present

## 2016-12-24 DIAGNOSIS — K622 Anal prolapse: Secondary | ICD-10-CM | POA: Diagnosis not present

## 2016-12-24 DIAGNOSIS — R402434 Glasgow coma scale score 3-8, 24 hours or more after hospital admission: Secondary | ICD-10-CM | POA: Diagnosis not present

## 2016-12-24 DIAGNOSIS — Z794 Long term (current) use of insulin: Secondary | ICD-10-CM | POA: Diagnosis not present

## 2016-12-24 DIAGNOSIS — I130 Hypertensive heart and chronic kidney disease with heart failure and stage 1 through stage 4 chronic kidney disease, or unspecified chronic kidney disease: Secondary | ICD-10-CM | POA: Diagnosis present

## 2016-12-24 DIAGNOSIS — D649 Anemia, unspecified: Secondary | ICD-10-CM

## 2016-12-24 DIAGNOSIS — D62 Acute posthemorrhagic anemia: Secondary | ICD-10-CM | POA: Diagnosis present

## 2016-12-24 DIAGNOSIS — E1122 Type 2 diabetes mellitus with diabetic chronic kidney disease: Secondary | ICD-10-CM | POA: Diagnosis present

## 2016-12-24 DIAGNOSIS — Z8249 Family history of ischemic heart disease and other diseases of the circulatory system: Secondary | ICD-10-CM | POA: Diagnosis not present

## 2016-12-24 DIAGNOSIS — Z85828 Personal history of other malignant neoplasm of skin: Secondary | ICD-10-CM

## 2016-12-24 DIAGNOSIS — I2510 Atherosclerotic heart disease of native coronary artery without angina pectoris: Secondary | ICD-10-CM | POA: Diagnosis present

## 2016-12-24 DIAGNOSIS — Z95810 Presence of automatic (implantable) cardiac defibrillator: Secondary | ICD-10-CM | POA: Diagnosis not present

## 2016-12-24 DIAGNOSIS — D631 Anemia in chronic kidney disease: Secondary | ICD-10-CM | POA: Diagnosis present

## 2016-12-24 DIAGNOSIS — R05 Cough: Secondary | ICD-10-CM

## 2016-12-24 DIAGNOSIS — Z4659 Encounter for fitting and adjustment of other gastrointestinal appliance and device: Secondary | ICD-10-CM

## 2016-12-24 DIAGNOSIS — N179 Acute kidney failure, unspecified: Secondary | ICD-10-CM | POA: Diagnosis present

## 2016-12-24 DIAGNOSIS — I10 Essential (primary) hypertension: Secondary | ICD-10-CM | POA: Diagnosis not present

## 2016-12-24 DIAGNOSIS — K264 Chronic or unspecified duodenal ulcer with hemorrhage: Secondary | ICD-10-CM | POA: Diagnosis present

## 2016-12-24 DIAGNOSIS — Z7982 Long term (current) use of aspirin: Secondary | ICD-10-CM | POA: Diagnosis not present

## 2016-12-24 DIAGNOSIS — K921 Melena: Secondary | ICD-10-CM | POA: Diagnosis present

## 2016-12-24 DIAGNOSIS — Z7189 Other specified counseling: Secondary | ICD-10-CM | POA: Diagnosis not present

## 2016-12-24 DIAGNOSIS — E782 Mixed hyperlipidemia: Secondary | ICD-10-CM | POA: Diagnosis present

## 2016-12-24 DIAGNOSIS — Z8546 Personal history of malignant neoplasm of prostate: Secondary | ICD-10-CM | POA: Diagnosis not present

## 2016-12-24 DIAGNOSIS — Z515 Encounter for palliative care: Secondary | ICD-10-CM

## 2016-12-24 DIAGNOSIS — T39395A Adverse effect of other nonsteroidal anti-inflammatory drugs [NSAID], initial encounter: Secondary | ICD-10-CM | POA: Diagnosis not present

## 2016-12-24 DIAGNOSIS — N189 Chronic kidney disease, unspecified: Secondary | ICD-10-CM

## 2016-12-24 DIAGNOSIS — N185 Chronic kidney disease, stage 5: Secondary | ICD-10-CM | POA: Diagnosis not present

## 2016-12-24 DIAGNOSIS — I472 Ventricular tachycardia: Secondary | ICD-10-CM | POA: Diagnosis not present

## 2016-12-24 DIAGNOSIS — K269 Duodenal ulcer, unspecified as acute or chronic, without hemorrhage or perforation: Secondary | ICD-10-CM | POA: Diagnosis not present

## 2016-12-24 DIAGNOSIS — D500 Iron deficiency anemia secondary to blood loss (chronic): Secondary | ICD-10-CM | POA: Diagnosis not present

## 2016-12-24 DIAGNOSIS — I4901 Ventricular fibrillation: Secondary | ICD-10-CM | POA: Diagnosis not present

## 2016-12-24 DIAGNOSIS — N184 Chronic kidney disease, stage 4 (severe): Secondary | ICD-10-CM | POA: Diagnosis present

## 2016-12-24 DIAGNOSIS — K279 Peptic ulcer, site unspecified, unspecified as acute or chronic, without hemorrhage or perforation: Secondary | ICD-10-CM | POA: Diagnosis not present

## 2016-12-24 DIAGNOSIS — R059 Cough, unspecified: Secondary | ICD-10-CM

## 2016-12-24 DIAGNOSIS — K922 Gastrointestinal hemorrhage, unspecified: Secondary | ICD-10-CM | POA: Diagnosis not present

## 2016-12-24 LAB — COMPREHENSIVE METABOLIC PANEL
ALK PHOS: 26 U/L — AB (ref 38–126)
ALT: 18 U/L (ref 17–63)
AST: 26 U/L (ref 15–41)
Albumin: 2.8 g/dL — ABNORMAL LOW (ref 3.5–5.0)
Anion gap: 10 (ref 5–15)
BUN: 90 mg/dL — AB (ref 6–20)
CALCIUM: 8 mg/dL — AB (ref 8.9–10.3)
CHLORIDE: 104 mmol/L (ref 101–111)
CO2: 21 mmol/L — ABNORMAL LOW (ref 22–32)
CREATININE: 3 mg/dL — AB (ref 0.61–1.24)
GFR, EST AFRICAN AMERICAN: 20 mL/min — AB (ref >60)
GFR, EST NON AFRICAN AMERICAN: 17 mL/min — AB (ref >60)
Glucose, Bld: 161 mg/dL — ABNORMAL HIGH (ref 65–99)
Potassium: 4.6 mmol/L (ref 3.5–5.1)
Sodium: 135 mmol/L (ref 135–145)
Total Bilirubin: 0.7 mg/dL (ref 0.3–1.2)
Total Protein: 5.8 g/dL — ABNORMAL LOW (ref 6.5–8.1)

## 2016-12-24 LAB — CBC WITH DIFFERENTIAL/PLATELET
BASOS PCT: 0 %
BLASTS: 0 %
Band Neutrophils: 1 %
Basophils Absolute: 0 10*3/uL (ref 0–0.1)
Eosinophils Absolute: 0 10*3/uL (ref 0–0.7)
Eosinophils Relative: 0 %
HEMATOCRIT: 15.5 % — AB (ref 40.0–52.0)
HEMOGLOBIN: 5 g/dL — AB (ref 13.0–18.0)
LYMPHS PCT: 15 %
Lymphs Abs: 1.4 10*3/uL (ref 1.0–3.6)
MCH: 30.1 pg (ref 26.0–34.0)
MCHC: 32.4 g/dL (ref 32.0–36.0)
MCV: 93.1 fL (ref 80.0–100.0)
MONO ABS: 0.6 10*3/uL (ref 0.2–1.0)
MYELOCYTES: 0 %
Metamyelocytes Relative: 1 %
Monocytes Relative: 7 %
NEUTROS PCT: 76 %
NRBC: 0 /100{WBCs}
Neutro Abs: 7.1 10*3/uL — ABNORMAL HIGH (ref 1.4–6.5)
OTHER: 0 %
PLATELETS: 521 10*3/uL — AB (ref 150–440)
PROMYELOCYTES ABS: 0 %
RBC: 1.66 MIL/uL — AB (ref 4.40–5.90)
RDW: 17.8 % — ABNORMAL HIGH (ref 11.5–14.5)
SMEAR REVIEW: ADEQUATE
WBC: 9.1 10*3/uL (ref 3.8–10.6)

## 2016-12-24 LAB — TYPE AND SCREEN
ABO/RH(D): O POS
Antibody Screen: NEGATIVE
Unit division: 0

## 2016-12-24 LAB — BPAM RBC
BLOOD PRODUCT EXPIRATION DATE: 201811292359
UNIT TYPE AND RH: 5100

## 2016-12-24 LAB — GLUCOSE, CAPILLARY: GLUCOSE-CAPILLARY: 116 mg/dL — AB (ref 65–99)

## 2016-12-24 LAB — HEMOGLOBIN: Hemoglobin: 4.8 g/dL — CL (ref 13.0–18.0)

## 2016-12-24 LAB — PREPARE RBC (CROSSMATCH)

## 2016-12-24 LAB — TROPONIN I

## 2016-12-24 MED ORDER — ACETAMINOPHEN 650 MG RE SUPP
650.0000 mg | Freq: Four times a day (QID) | RECTAL | Status: DC | PRN
Start: 2016-12-24 — End: 2016-12-29

## 2016-12-24 MED ORDER — BISACODYL 5 MG PO TBEC: 5.0000 mg | DELAYED_RELEASE_TABLET | Freq: Every day | ORAL | Status: DC | PRN

## 2016-12-24 MED ORDER — CARVEDILOL 6.25 MG PO TABS
6.2500 mg | ORAL_TABLET | Freq: Two times a day (BID) | ORAL | Status: DC
  Administered 2016-12-24 – 2016-12-26 (×4): 6.25 mg via ORAL
  Filled 2016-12-24 (×4): qty 1

## 2016-12-24 MED ORDER — OCUVITE-LUTEIN PO CAPS
1.0000 | ORAL_CAPSULE | Freq: Two times a day (BID) | ORAL | Status: DC
  Administered 2016-12-24 – 2016-12-29 (×10): 1 via ORAL
  Filled 2016-12-24 (×12): qty 1

## 2016-12-24 MED ORDER — VITAMIN E 180 MG (400 UNIT) PO CAPS
400.0000 [IU] | ORAL_CAPSULE | Freq: Every day | ORAL | Status: DC
  Administered 2016-12-24 – 2016-12-29 (×6): 400 [IU] via ORAL
  Filled 2016-12-24 (×7): qty 1

## 2016-12-24 MED ORDER — SODIUM CHLORIDE 0.9 % IV SOLN
80.0000 mg | Freq: Once | INTRAVENOUS | Status: AC
  Administered 2016-12-24: 16:00:00 80 mg via INTRAVENOUS
  Filled 2016-12-24: qty 80

## 2016-12-24 MED ORDER — ONDANSETRON HCL 4 MG PO TABS: 4.0000 mg | ORAL_TABLET | Freq: Four times a day (QID) | ORAL | Status: DC | PRN

## 2016-12-24 MED ORDER — HYDROCODONE-ACETAMINOPHEN 5-325 MG PO TABS
1.0000 | ORAL_TABLET | ORAL | Status: DC | PRN
  Administered 2016-12-30: 1 via ORAL
  Filled 2016-12-24: qty 1

## 2016-12-24 MED ORDER — AMLODIPINE BESYLATE 5 MG PO TABS
2.5000 mg | ORAL_TABLET | Freq: Every day | ORAL | Status: DC
  Administered 2016-12-25: 2.5 mg via ORAL
  Filled 2016-12-24: qty 1

## 2016-12-24 MED ORDER — PANTOPRAZOLE SODIUM 40 MG IV SOLR: 40.0000 mg | Freq: Two times a day (BID) | INTRAVENOUS | Status: DC

## 2016-12-24 MED ORDER — SODIUM CHLORIDE 0.9 % IV SOLN
10.0000 mL/h | Freq: Once | INTRAVENOUS | Status: AC
  Administered 2016-12-24: 10 mL/h via INTRAVENOUS

## 2016-12-24 MED ORDER — ENSURE ENLIVE PO LIQD
237.0000 mL | Freq: Three times a day (TID) | ORAL | Status: DC
  Administered 2016-12-24 – 2016-12-29 (×6): 237 mL via ORAL

## 2016-12-24 MED ORDER — ONDANSETRON HCL 4 MG/2ML IJ SOLN
4.0000 mg | Freq: Four times a day (QID) | INTRAMUSCULAR | Status: DC | PRN
  Administered 2016-12-28: 4 mg via INTRAVENOUS
  Filled 2016-12-24: qty 2

## 2016-12-24 MED ORDER — POLYETHYLENE GLYCOL 3350 17 G PO PACK: 17.0000 g | PACK | Freq: Every day | ORAL | Status: DC | PRN

## 2016-12-24 MED ORDER — ADULT MULTIVITAMIN W/MINERALS CH
1.0000 | ORAL_TABLET | Freq: Every day | ORAL | Status: DC
  Administered 2016-12-25: 1 via ORAL
  Filled 2016-12-24: qty 1

## 2016-12-24 MED ORDER — ACETAMINOPHEN 325 MG PO TABS
650.0000 mg | ORAL_TABLET | Freq: Four times a day (QID) | ORAL | Status: DC | PRN
  Administered 2016-12-28 – 2016-12-29 (×2): 650 mg via ORAL
  Filled 2016-12-24 (×2): qty 2

## 2016-12-24 MED ORDER — VITAMIN D 1000 UNITS PO TABS
2000.0000 [IU] | ORAL_TABLET | Freq: Every day | ORAL | Status: DC
  Administered 2016-12-25 – 2016-12-29 (×5): 2000 [IU] via ORAL
  Filled 2016-12-24 (×5): qty 2

## 2016-12-24 MED ORDER — HYDRALAZINE HCL 20 MG/ML IJ SOLN: 10.0000 mg | Freq: Four times a day (QID) | INTRAMUSCULAR | Status: DC | PRN

## 2016-12-24 MED ORDER — SODIUM CHLORIDE 0.9 % IV SOLN
8.0000 mg/h | INTRAVENOUS | Status: DC
  Administered 2016-12-24: 8 mg/h via INTRAVENOUS
  Filled 2016-12-24: qty 80

## 2016-12-24 NOTE — Progress Notes (Signed)
Protonix bolus and infusion given to RN.

## 2016-12-24 NOTE — Telephone Encounter (Signed)
Patient agreed to go to ER and is there now

## 2016-12-24 NOTE — Telephone Encounter (Signed)
Lonnie called from lab to report that patient hgb is 4.8 and hct is 14.3. I discussed with Dr Janese [place] and she said for patient to go to ER. Patient is not answering phone, I called lab to find patient and was told that Erline Levine is taking care of him. Erline Levine states that patient went to cafe for lunch and she will discuss patient unwillingness to go to ER last evening and whether to transfuse here or try  To get patient to ER.

## 2016-12-24 NOTE — Telephone Encounter (Signed)
Spoke with patient's wife via telephone. She will bring him to [facility] today for lab and possible blood transfusion, per Dr. Elroy Channel instructions. Dr. Janese [place] will put orders in for lab/transfusion. Appointments scheduled via [facility] in [place].   dhs

## 2016-12-24 NOTE — Telephone Encounter (Signed)
Patients wife called to report that over the weekend patient became extremely weak. Unable to walk with out his cane and has continued to decline. She spoke with his PCP over the weekend and was advised to take him to the ER for evaluation patient refused to go to ER. She would like Dr. Janese [place] to evaluate him for blood transfusion.

## 2016-12-24 NOTE — ED Provider Notes (Signed)
[facility] Emergency Department Provider Note ____________________________________________   I have reviewed the triage vital signs and the triage nursing note.  HISTORY  Chief Complaint Abnormal Lab   Historian Patient  HPI Aaron Barnett is a 83 y.o. male presents from cancer center where hemoglobin was found to be "4.  ".  Wife called hematologist today because patient was fatigued and looked pale. Patient has a history of anemia, admitted with transfusion per wife early in October.  Unclear etiology of anemia per family - no prior gi bleeding or black or bloody stool.  Normal bm this morning.  He has had occasional left-sided pain on and off for months.  He states that he had been worked up for that, no certain etiology from that perspective.   Past Medical History:  Diagnosis Date  . CHF (congestive heart failure) ([place])   . Diabetes mellitus without complication ([place])   . HTN (hypertension)   . Prostate cancer ([place])   . Skin cancer     Patient Active Problem List   Diagnosis Date Noted  . Anemia of chronic kidney failure 12/19/2016  . ARF (acute renal failure) ([place]) 12/07/2016  . Aortic atherosclerosis ([place]) 04/29/2015  . CKD (chronic kidney disease) stage 3, GFR 30-59 ml/min (HCC) 04/29/2015  . Benign essential hypertension 04/29/2015  . ARMD (age related macular degeneration) 12/13/2014  . Controlled type 2 diabetes mellitus without complication, without long-term current use of insulin ([place]) 12/13/2014  . Biventricular ICD (implantable cardioverter-defibrillator) in place 12/07/2014  . Dilated cardiomyopathy ([place]) 12/07/2014  . Mixed hyperlipidemia 12/07/2014    Past Surgical History:  Procedure Laterality Date  . PROSTATECTOMY      Prior to Admission medications   Medication Sig Start Date End Date Taking? Authorizing Provider  amLODipine (NORVASC) 2.5 MG tablet Take 2.5 mg by mouth daily.   Yes [provider]  aspirin 325 MG  tablet Take 325 mg by mouth daily.   Yes [provider]  carvedilol (COREG) 6.25 MG tablet Take 6.25 mg by mouth 2 (two) times daily with a meal.   Yes [provider]  Cholecalciferol (VITAMIN D3) 2000 units TABS Take 1 tablet by mouth daily.   Yes [provider]  feeding supplement, ENSURE ENLIVE, (ENSURE ENLIVE) LIQD Take 237 mLs by mouth 3 (three) times daily between meals. 12/11/16  Yes Fritzi Mandes, MD  fenofibrate (TRICOR) 145 MG tablet Take 145 mg by mouth daily.   Yes [provider]  furosemide (LASIX) 20 MG tablet Take 20 mg by mouth daily.   Yes [provider]  Multiple Vitamin (MULTIVITAMIN) capsule Take 1 capsule by mouth daily.   Yes [provider]  Multiple Vitamins-Minerals (PRESERVISION AREDS 2+MULTI VIT PO) Take 1 capsule by mouth 2 (two) times daily.   Yes [provider]  vitamin E 400 UNIT capsule Take 1 capsule by mouth daily.   Yes [provider]  nitroGLYCERIN (NITROSTAT) 0.4 MG SL tablet Place 1 tablet under the tongue every 5 (five) minutes as needed. 06/12/16   [provider]    Allergies  Allergen Reactions  . Doxycycline Rash  . Lisinopril Rash  . Penicillins Rash    Has patient had a PCN reaction causing immediate rash, facial/tongue/throat swelling, SOB or lightheadedness with hypotension: Yes Has patient had a PCN reaction causing severe rash involving mucus membranes or skin necrosis: No Has patient had a PCN reaction that required hospitalization: No Has patient had a PCN reaction occurring within  the last 10 years: No If all of the above answers are "NO", then may proceed with Cephalosporin use.    Family History  Problem Relation Age of Onset  . Hypertension Mother     Social History Social History  Substance Use Topics  . Smoking status: Former Smoker    Types: Cigarettes  . Smokeless tobacco: Never Used  . Alcohol use Yes    Review of  Systems  Constitutional: Negative for fever. Eyes: Negative for visual changes. ENT: Negative for sore throat. Cardiovascular: Negative for chest pain. Respiratory: Negative for shortness of breath. Gastrointestinal: Negative for abdominal pain, vomiting and diarrhea. Genitourinary: Negative for dysuria. Musculoskeletal: Negative for back pain. Skin: Negative for rash. Neurological: Negative for headache.  ____________________________________________   PHYSICAL EXAM:  VITAL SIGNS: ED Triage Vitals [11/29/2016 1255]  Enc Vitals Group     BP (!) 150/44     Pulse Rate 83     Resp 18     Temp 98 F (36.7 C)     Temp Source Oral     SpO2 100 %     Weight 140 lb (63.5 kg)     Height 5\' 10"  (1.778 m)     Head Circumference      Peak Flow      Pain Score 0     Pain Loc      Pain Edu?      Excl. in [place]?      Constitutional: Alert and oriented. Well appearing and in no distress.  Pale HEENT   Head: Normocephalic and atraumatic.      Eyes: Conjunctivae are normal. Pupils equal and round.       Ears:         Nose: No congestion/rhinnorhea.   Mouth/Throat: Mucous membranes are moist.   Neck: No stridor. Cardiovascular/Chest: Normal rate, regular rhythm.  No murmurs, rubs, or gallops. Respiratory: Normal respiratory effort without tachypnea nor retractions. Breath sounds are clear and equal bilaterally. No wheezes/rales/rhonchi. Gastrointestinal: Soft. No distention, no guarding, no rebound. Nontender.    Genitourinary/rectal:Deferred Musculoskeletal: Nontender with normal range of motion in all extremities. No joint effusions.  No lower extremity tenderness.  No edema. Neurologic:  Normal speech and language. No gross or focal neurologic deficits are appreciated. Skin:  Skin is warm, dry and intact. No rash noted. Psychiatric: Mood and affect are normal. Speech and behavior are normal. Patient exhibits appropriate insight and  judgment.   ____________________________________________  LABS (pertinent positives/negatives) I, Lisa Roca, MD the attending physician have reviewed the labs noted below.  Labs Reviewed  CBC WITH DIFFERENTIAL/PLATELET - Abnormal; Notable for the following:       Result Value   RBC 1.66 (*)    Hemoglobin 5.0 (*)    HCT 15.5 (*)    RDW 17.8 (*)    Platelets 521 (*)    All other components within normal limits  COMPREHENSIVE METABOLIC PANEL - Abnormal; Notable for the following:    CO2 21 (*)    Glucose, Bld 161 (*)    BUN 90 (*)    Creatinine, Ser 3.00 (*)    Calcium 8.0 (*)    Total Protein 5.8 (*)    Albumin 2.8 (*)    Alkaline Phosphatase 26 (*)    GFR calc non Af Amer 17 (*)    GFR calc Af Amer 20 (*)    All other components within normal limits  TROPONIN I  TYPE AND SCREEN  PREPARE RBC (CROSSMATCH)  ____________________________________________    EKG I, Lisa Roca, MD, the attending physician have personally viewed and interpreted all ECGs.  70 bpm.  Atrially paced rhythm.  Normal ST and T wave ____________________________________________  RADIOLOGY All Xrays were viewed by me.  Imaging interpreted by Radiologist, and I, Lisa Roca, MD the attending physician have reviewed the radiologist interpretation noted below.  None __________________________________________  PROCEDURES  Procedure(s) performed: None  Critical Care performed: None  ____________________________________________  Current Facility-Administered Medications on File Prior to Encounter  Medication Dose Route Frequency Provider Last Rate Last Dose  . epoetin alfa (EPOGEN,PROCRIT) injection 10,000 Units  10,000 Units Subcutaneous Weekly Sindy Guadeloupe, MD   10,000 Units at 12/21/16 1535   Current Outpatient Prescriptions on File Prior to Encounter  Medication Sig Dispense Refill  . amLODipine (NORVASC) 2.5 MG tablet Take 2.5 mg by mouth daily.    Marland Kitchen aspirin 325 MG tablet Take  325 mg by mouth daily.    . carvedilol (COREG) 6.25 MG tablet Take 6.25 mg by mouth 2 (two) times daily with a meal.    . Cholecalciferol (VITAMIN D3) 2000 units TABS Take 1 tablet by mouth daily.    . feeding supplement, ENSURE ENLIVE, (ENSURE ENLIVE) LIQD Take 237 mLs by mouth 3 (three) times daily between meals. 237 mL 12  . fenofibrate (TRICOR) 145 MG tablet Take 145 mg by mouth daily.    . furosemide (LASIX) 20 MG tablet Take 20 mg by mouth daily.    . Multiple Vitamin (MULTIVITAMIN) capsule Take 1 capsule by mouth daily.    . Multiple Vitamins-Minerals (PRESERVISION AREDS 2+MULTI VIT PO) Take 1 capsule by mouth 2 (two) times daily.    . vitamin E 400 UNIT capsule Take 1 capsule by mouth daily.    . nitroGLYCERIN (NITROSTAT) 0.4 MG SL tablet Place 1 tablet under the tongue every 5 (five) minutes as needed.      ____________________________________________  ED COURSE / ASSESSMENT AND PLAN  Pertinent labs & imaging results that were available during my care of the patient were reviewed by me and considered in my medical decision making (see chart for details).   Patient does appear pale.  He had a reported a.m. blood draw with a hemoglobin of 4.7.  We will recheck to confirm hemoglobin.  Type and screen sent.  Patient consents for blood.  Patient will be admitted for blood transfusion.  Unclear etiology, patient denies black or bloody stools.  Heme positive stool here.  Will place on protonix.  Hemoglobin confirmed anemic at 5.0.  Patient placed for transfusion 3 units, given significant anemia, symptoms, and GI bleeding.      CONSULTATIONS:   Hospitalist for admission.   Patient / Family / Caregiver informed of clinical course, medical decision-making process, and agree with plan.  ___________________________________________   FINAL CLINICAL IMPRESSION(S) / ED DIAGNOSES   Final diagnoses:  Symptomatic anemia  Occult GI bleeding              Note:  This dictation was prepared with Dragon dictation. Any transcriptional errors that result from this process are unintentional    Lisa Roca, MD 12/17/2016 1444

## 2016-12-24 NOTE — ED Triage Notes (Signed)
Pt arrives from cancer center for low hgb and blood transfusion, states it was "4 something", pt pale and states feeling weak, states hx of blood transfusion last week, denies any rectal bleeding, states he is on ASA

## 2016-12-24 NOTE — Telephone Encounter (Signed)
Spoke with patient's wife via telephone. She will bring him to [facility] today for lab and possible blood transfusion, per Dr. Elroy Channel instructions. Dr. Janese Banks will put orders in for lab/transfusion. Appointments scheduled via [place] in [place].   dhs

## 2016-12-24 NOTE — H&P (Signed)
[facility] NAME: Aaron Barnett    MR#:  [id number]  DATE OF BIRTH:  31-Mar-1927  DATE OF ADMISSION:  12/08/2016  PRIMARY CARE PHYSICIAN: Tracie Harrier, MD   REQUESTING/REFERRING PHYSICIAN dr Reita Cliche  CHIEF COMPLAINT:   Low hemoglobin and fatigue HISTORY OF PRESENT ILLNESS:  Aaron Barnett  is a 83 y.o. male with a known history of  CKD stage 3,chronic systolic heart failure ejection fraction 50%, DM, dilated cardiomyopathy status post AICD, who presented to hematologist office due to low hemoglobin and fatigue.  Patient reports over the past 3 days he has had increasing shortness of breath and fatigue.  His wife noticed that he is very pale.  She called the hematologist office for follow-up appointment.  Apparently his hemoglobin was 4.6 in the office. He is guaiac positive in the emergency room.  Of note patient was hospitalized a few weeks ago due to acute on chronic kidney disease as well as anemia.  He did receive 1 unit of packed red blood cells during the hospitalization.  Apparently he was guaiac negative and did not want GI follow-up at that time.  PAST MEDICAL HISTORY:   Past Medical History:  Diagnosis Date  . CHF (congestive heart failure) ([place])   . Diabetes mellitus without complication ([place])   . HTN (hypertension)   . Prostate cancer ([place])   . Skin cancer     PAST SURGICAL HISTORY:   Past Surgical History:  Procedure Laterality Date  . PROSTATECTOMY      SOCIAL HISTORY:   Social History  Substance Use Topics  . Smoking status: Former Smoker    Types: Cigarettes  . Smokeless tobacco: Never Used  . Alcohol use Yes    FAMILY HISTORY:   Family History  Problem Relation Age of Onset  . Hypertension Mother     DRUG ALLERGIES:   Allergies  Allergen Reactions  . Doxycycline Rash  . Lisinopril Rash  . Penicillins Rash    Has patient had a PCN reaction causing immediate rash, facial/tongue/throat swelling, SOB  or lightheadedness with hypotension: Yes Has patient had a PCN reaction causing severe rash involving mucus membranes or skin necrosis: No Has patient had a PCN reaction that required hospitalization: No Has patient had a PCN reaction occurring within the last 10 years: No If all of the above answers are "NO", then may proceed with Cephalosporin use.    REVIEW OF SYSTEMS:   Review of Systems  Constitutional: Positive for malaise/fatigue. Negative for chills and fever.  HENT: Negative.  Negative for ear discharge, ear pain, hearing loss, nosebleeds and sore throat.   Eyes: Negative.  Negative for blurred vision and pain.  Respiratory: Positive for shortness of breath. Negative for cough, hemoptysis and wheezing.   Cardiovascular: Negative.  Negative for chest pain, palpitations and leg swelling.  Gastrointestinal: Negative.  Negative for abdominal pain, blood in stool, diarrhea, nausea and vomiting.  Genitourinary: Negative.  Negative for dysuria.  Musculoskeletal: Negative.  Negative for back pain.  Skin: Negative.   Neurological: Positive for weakness. Negative for dizziness, tremors, speech change, focal weakness, seizures and headaches.  Endo/Heme/Allergies: Negative.  Does not bruise/bleed easily.  Psychiatric/Behavioral: Negative.  Negative for depression, hallucinations and suicidal ideas.    MEDICATIONS AT HOME:   Prior to Admission medications   Medication Sig Start Date End Date Taking? Authorizing Provider  amLODipine (NORVASC) 2.5 MG tablet Take 2.5 mg by mouth daily.   Yes [provider]  aspirin 325 MG tablet Take 325 mg by mouth daily.   Yes [provider]  carvedilol (COREG) 6.25 MG tablet Take 6.25 mg by mouth 2 (two) times daily with a meal.   Yes [provider]  Cholecalciferol (VITAMIN D3) 2000 units TABS Take 1 tablet by mouth daily.   Yes [provider]  feeding supplement, ENSURE ENLIVE, (ENSURE ENLIVE) LIQD Take 237 mLs by  mouth 3 (three) times daily between meals. 12/11/16  Yes Fritzi Mandes, MD  fenofibrate (TRICOR) 145 MG tablet Take 145 mg by mouth daily.   Yes [provider]  furosemide (LASIX) 20 MG tablet Take 20 mg by mouth daily.   Yes [provider]  Multiple Vitamin (MULTIVITAMIN) capsule Take 1 capsule by mouth daily.   Yes [provider]  Multiple Vitamins-Minerals (PRESERVISION AREDS 2+MULTI VIT PO) Take 1 capsule by mouth 2 (two) times daily.   Yes [provider]  vitamin E 400 UNIT capsule Take 1 capsule by mouth daily.   Yes [provider]  nitroGLYCERIN (NITROSTAT) 0.4 MG SL tablet Place 1 tablet under the tongue every 5 (five) minutes as needed. 06/12/16   [provider]      VITAL SIGNS:  Blood pressure (!) 149/59, pulse 73, temperature 98 F (36.7 C), temperature source Oral, resp. rate (!) 21, height 5\' 10"  (1.778 m), weight 63.5 kg (140 lb), SpO2 100 %.  PHYSICAL EXAMINATION:   Physical Exam  Constitutional: He is oriented to person, place, and time and well-developed, well-nourished, and in no distress. No distress.  HENT:  Head: Normocephalic.  Eyes: No scleral icterus.  Neck: Normal range of motion. Neck supple. No JVD present. No tracheal deviation present.  Cardiovascular: Normal rate and regular rhythm.  Exam reveals no gallop and no friction rub.   Murmur heard. Pulmonary/Chest: Effort normal and breath sounds normal. No respiratory distress. He has no wheezes. He has no rales. He exhibits no tenderness.  Abdominal: Soft. Bowel sounds are normal. He exhibits no distension and no mass. There is no tenderness. There is no rebound and no guarding.  Musculoskeletal: Normal range of motion. He exhibits edema.  Neurological: He is alert and oriented to person, place, and time.  Skin: Skin is warm. No rash noted. No erythema.  He is very pale  Psychiatric: Affect and judgment normal.      LABORATORY PANEL:    CBC  Recent Labs Lab 12/02/2016 1302  WBC 9.1  HGB 5.0*  HCT 15.5*  PLT 521*   ------------------------------------------------------------------------------------------------------------------  Chemistries   Recent Labs Lab 12/23/2016 1302  NA 135  K 4.6  CL 104  CO2 21*  GLUCOSE 161*  BUN 90*  CREATININE 3.00*  CALCIUM 8.0*  AST 26  ALT 18  ALKPHOS 26*  BILITOT 0.7   ------------------------------------------------------------------------------------------------------------------  Cardiac Enzymes  Recent Labs Lab 11/29/2016 1302  TROPONINI <0.03   ------------------------------------------------------------------------------------------------------------------  RADIOLOGY:  No results found.  EKG:  Atrially paced rhythm no ST elevation or depression  IMPRESSION AND PLAN:   83 year old male with history of chronic systolic heart failure ejection fraction 50%, relative cardiomyopathy with AICD and chronic kidney disease stage III who presents with symptomatic anemia.  1.  Symptomatic acute on chronic anemia: Patient is consented for blood transfusion ED physician has written for 3 units of blood, I have asked nurse to give 2 units and check hemoglobin GI consultation for guaiac positive stool Hematology consultation requested as well Stop aspirin for now  2.  Acute on chronic kidney failure stage III Nephrology consultation requested Stop nephrotoxic medications  4.  Dilated cardiomyopathy: Continue Coreg  5.  Essential hypertension: Continue Norvasc and Coreg     All the records are reviewed and case discussed with ED provider. Management plans discussed with the patient and  in agreement  CODE STATUS: FULL  TOTAL TIME TAKING CARE OF THIS PATIENT: 45 minutes.    Marlena Barbato M.D on 12/11/2016 at 3:14 PM  Between 7am to 6pm - Pager - [phone]  After 6pm go to [email or web address] - password [facility]  Office   [phone]  CC: Primary care physician; Tracie Harrier, MD

## 2016-12-24 NOTE — ED Notes (Signed)
Pt wife took home the pt 2 wallets and keys.

## 2016-12-25 ENCOUNTER — Encounter: Admission: EM | Disposition: E | Payer: Self-pay | Source: Home / Self Care | Attending: Internal Medicine

## 2016-12-25 ENCOUNTER — Inpatient Hospital Stay: Payer: Medicare Other | Admitting: Anesthesiology

## 2016-12-25 DIAGNOSIS — K264 Chronic or unspecified duodenal ulcer with hemorrhage: Principal | ICD-10-CM

## 2016-12-25 DIAGNOSIS — R195 Other fecal abnormalities: Secondary | ICD-10-CM

## 2016-12-25 DIAGNOSIS — D649 Anemia, unspecified: Secondary | ICD-10-CM

## 2016-12-25 DIAGNOSIS — D500 Iron deficiency anemia secondary to blood loss (chronic): Secondary | ICD-10-CM

## 2016-12-25 HISTORY — PX: ESOPHAGOGASTRODUODENOSCOPY (EGD) WITH PROPOFOL: SHX5813

## 2016-12-25 LAB — BASIC METABOLIC PANEL
ANION GAP: 7 (ref 5–15)
BUN: 90 mg/dL — ABNORMAL HIGH (ref 6–20)
CHLORIDE: 110 mmol/L (ref 101–111)
CO2: 20 mmol/L — AB (ref 22–32)
Calcium: 7.4 mg/dL — ABNORMAL LOW (ref 8.9–10.3)
Creatinine, Ser: 2.76 mg/dL — ABNORMAL HIGH (ref 0.61–1.24)
GFR calc non Af Amer: 19 mL/min — ABNORMAL LOW (ref >60)
GFR, EST AFRICAN AMERICAN: 22 mL/min — AB (ref >60)
GLUCOSE: 87 mg/dL (ref 65–99)
POTASSIUM: 4 mmol/L (ref 3.5–5.1)
Sodium: 137 mmol/L (ref 135–145)

## 2016-12-25 LAB — IRON AND TIBC
Iron: 87 ug/dL (ref 45–182)
SATURATION RATIOS: 35 % (ref 17.9–39.5)
TIBC: 251 ug/dL (ref 250–450)
UIBC: 164 ug/dL

## 2016-12-25 LAB — CBC
HEMATOCRIT: 24.2 % — AB (ref 40.0–52.0)
HEMOGLOBIN: 8.2 g/dL — AB (ref 13.0–18.0)
MCH: 30.9 pg (ref 26.0–34.0)
MCHC: 33.8 g/dL (ref 32.0–36.0)
MCV: 91.5 fL (ref 80.0–100.0)
Platelets: 335 10*3/uL (ref 150–440)
RBC: 2.65 MIL/uL — AB (ref 4.40–5.90)
RDW: 18.1 % — ABNORMAL HIGH (ref 11.5–14.5)
WBC: 8 10*3/uL (ref 3.8–10.6)

## 2016-12-25 LAB — RETICULOCYTES
RBC.: 2.62 MIL/uL — ABNORMAL LOW (ref 4.40–5.90)
Retic Count, Absolute: 68.1 10*3/uL (ref 19.0–183.0)
Retic Ct Pct: 2.6 % (ref 0.4–3.1)

## 2016-12-25 LAB — GLUCOSE, CAPILLARY
GLUCOSE-CAPILLARY: 101 mg/dL — AB (ref 65–99)
GLUCOSE-CAPILLARY: 115 mg/dL — AB (ref 65–99)

## 2016-12-25 LAB — VITAMIN B12: VITAMIN B 12: 624 pg/mL (ref 180–914)

## 2016-12-25 LAB — FOLATE: FOLATE: 15.1 ng/mL (ref >5.9)

## 2016-12-25 LAB — FERRITIN: Ferritin: 168 ng/mL (ref 24–336)

## 2016-12-25 SURGERY — ESOPHAGOGASTRODUODENOSCOPY (EGD) WITH PROPOFOL
Anesthesia: General

## 2016-12-25 MED ORDER — GLYCOPYRROLATE 0.2 MG/ML IJ SOLN
INTRAMUSCULAR | Status: DC | PRN
  Administered 2016-12-25: 0.2 mg via INTRAVENOUS

## 2016-12-25 MED ORDER — SODIUM CHLORIDE 0.9 % IV SOLN
INTRAVENOUS | Status: DC | PRN
  Administered 2016-12-25: 12:00:00 via INTRAVENOUS

## 2016-12-25 MED ORDER — SODIUM CHLORIDE 0.9 % IJ SOLN
PREFILLED_SYRINGE | INTRAMUSCULAR | Status: DC | PRN
  Administered 2016-12-25: 7 mL
  Administered 2016-12-25: 10 mL

## 2016-12-25 MED ORDER — PROPOFOL 500 MG/50ML IV EMUL
INTRAVENOUS | Status: AC
  Filled 2016-12-25: qty 50

## 2016-12-25 MED ORDER — PROPOFOL 10 MG/ML IV BOLUS
INTRAVENOUS | Status: DC | PRN
  Administered 2016-12-25: 30 mg via INTRAVENOUS

## 2016-12-25 MED ORDER — LIDOCAINE HCL (CARDIAC) 20 MG/ML IV SOLN
INTRAVENOUS | Status: DC | PRN
  Administered 2016-12-25: 60 mg via INTRAVENOUS

## 2016-12-25 MED ORDER — PROPOFOL 500 MG/50ML IV EMUL
INTRAVENOUS | Status: DC | PRN
  Administered 2016-12-25: 100 ug/kg/min via INTRAVENOUS

## 2016-12-25 MED ORDER — EPINEPHRINE PF 1 MG/10ML IJ SOSY
PREFILLED_SYRINGE | INTRAMUSCULAR | Status: AC
  Filled 2016-12-25: qty 10

## 2016-12-25 MED ORDER — PHENYLEPHRINE HCL 10 MG/ML IJ SOLN
INTRAMUSCULAR | Status: DC | PRN
  Administered 2016-12-25 (×2): 200 ug via INTRAVENOUS

## 2016-12-25 MED ORDER — LIDOCAINE HCL (PF) 2 % IJ SOLN
INTRAMUSCULAR | Status: AC
  Filled 2016-12-25: qty 10

## 2016-12-25 MED ORDER — GLYCOPYRROLATE 0.2 MG/ML IJ SOLN
INTRAMUSCULAR | Status: AC
  Filled 2016-12-25: qty 1

## 2016-12-25 NOTE — Progress Notes (Signed)
Please see EGD report under chart review and Procedures tab for details and findings of procedure. If patient re-bleeds please consult Vascular Surgery or Interventional radiology immediately for evaluation of embolization. Hemoclips placed at ulcer site during EGD today would help localize the site of the bleed as well.   If patient stays hemodynamically stable later today and has no episodes of active GI bleeding primary team can start clear liquid diet after monitoring closely. Small amount of melena is anticipated due to the active bleeding during the procedure, but is expected to be only small amounts. If hemodynamic changes occur with active GI bleeding please have low threshold to page Vascular surgery to evaluate for embolization. Thank You.

## 2016-12-25 NOTE — Op Note (Signed)
[facility] Patient Name: Aaron Barnett Procedure Date: 12/26/2016 11:25 AM MRN: [id number] Account #: [id number] Date of Birth: 1927/12/24 Admit Type: Inpatient Age: 83 Room: [facility] ENDO ROOM 1 Gender: Male Note Status: Finalized Procedure:            Upper GI endoscopy Indications:          Iron deficiency anemia secondary to chronic blood loss Providers:            [place] Nabor B. Bonna Gains MD, MD Referring MD:         Tracie Harrier, MD (Referring MD) Medicines:            Monitored Anesthesia Care Complications:        No immediate complications. Procedure:            Pre-Anesthesia Assessment:                       - ASA Grade Assessment: III - A patient with severe                        systemic disease.                       - After reviewing the risks and benefits, the patient                        was deemed in satisfactory condition to undergo the                        procedure.                       - Prior to the procedure, a History and Physical was                        performed, and patient medications, allergies and                        sensitivities were reviewed. The patient's tolerance of                        previous anesthesia was reviewed.                       - The risks and benefits of the procedure and the                        sedation options and risks were discussed with the                        patient. All questions were answered and informed                        consent was obtained.                       After obtaining informed consent, the endoscope was                        passed under direct vision. Throughout the procedure,  the patient's blood pressure, pulse, and oxygen                        saturations were monitored continuously. The Endoscope                        was introduced through the mouth, and advanced to the                        second part of duodenum. The  upper GI endoscopy was                        accomplished with ease. The patient tolerated the                        procedure well. Findings:      The examined esophagus was normal.      The entire examined stomach was normal.      One non-obstructing with adhererent clot cratered duodenal ulcer with       adherent clot was found in the duodenal bulb. The lesion was 25 mm in       largest dimension. There is no evidence of perforation. The adherent       clot was removed and active bleeding was seen at the ulcer site. Area       was successfully injected with a total of 17 mL of a 1:10,000 solution       of epinephrine (10cc first time, then another 7cc later) for hemostasis.       Bipolar Coagulation Gold Probe was also used at the ulcer site as well       to stop active bleeding. To stop active bleeding, four hemostatic clips       were successfully placed. There was no bleeding at the end of the       procedure. Impression:           - Normal esophagus.                       - Normal stomach.                       - One non-obstructing with adhererent clot duodenal                        ulcer with adherent clot. There is no evidence of                        perforation. Injected, bipolar coagulation used, Clips                        were placed with successful hemostasis                       - No specimens collected. Recommendation:       - Transfer patient to the ICU for close observation and                        monitoring.                       - Use Protonix (pantoprazole) 40 mg IV BID.                       -  Hold ASA 325mg  today until Hgb stabilizes and given                        likely NSAID induced ulcer, consult with cardiology to                        discuss the need for ongoing Aspirin or if dose can be                        decreased                       - The findings and recommendations were discussed with                        the patient's primary  physician (Dr. Carlynn Spry and                        patient's nurse).                       - Obtain stool for H Pylori Ag                       - NPO today, sips of water with meds ok. If Hgb                        stabilizes can discuss starting diet tomorrow                       - Observe patient's clinical course.                       - Please page GI with any signs of active Bleeding or                        acute hemodynamic changes                       Images were taken but were not retrieved due to                        equipment error Procedure Code(s):    --- Professional ---                       [phone], Esophagogastroduodenoscopy, flexible, transoral;                        with control of bleeding, any method Diagnosis Code(s):    --- Professional ---                       D50.0, Iron deficiency anemia secondary to blood loss                        (chronic)                       K26.4, Chronic or unspecified duodenal ulcer with                        hemorrhage CPT copyright 2016 American  Medical Association. All rights reserved. The codes documented in this report are preliminary and upon coder review may  be revised to meet current compliance requirements.  [name], MD Margretta Sidle B. Mana Morison MD, MD 12/07/2016 2:08:09 PM This report has been signed electronically. Number of Addenda: 0 Note Initiated On: 12/04/2016 11:25 AM      [facility]

## 2016-12-25 NOTE — Progress Notes (Signed)
Transfer   Report called to CCU by this RN. Will prepare to move pt.

## 2016-12-25 NOTE — H&P (Signed)
Aaron Antigua, MD [address], [place], [place], [place], [address] [address], [place], [place], [place], [address] Phone: [phone]  Fax: [phone]  Primary Care Physician:  Tracie Harrier, MD   Pre-Procedure History & Physical: HPI:  Aaron Barnett is a 83 y.o. male is here for an colonoscopy.   Past Medical History:  Diagnosis Date  . CHF (congestive heart failure) ([place])   . Diabetes mellitus without complication ([place])   . HTN (hypertension)   . Prostate cancer ([place])   . Skin cancer     Past Surgical History:  Procedure Laterality Date  . PROSTATECTOMY      Prior to Admission medications   Medication Sig Start Date End Date Taking? Authorizing Provider  amLODipine (NORVASC) 2.5 MG tablet Take 2.5 mg by mouth daily.   Yes [provider]  aspirin 325 MG tablet Take 325 mg by mouth daily.   Yes [provider]  carvedilol (COREG) 6.25 MG tablet Take 6.25 mg by mouth 2 (two) times daily with a meal.   Yes [provider]  Cholecalciferol (VITAMIN D3) 2000 units TABS Take 1 tablet by mouth daily.   Yes [provider]  feeding supplement, ENSURE ENLIVE, (ENSURE ENLIVE) LIQD Take 237 mLs by mouth 3 (three) times daily between meals. 12/11/16  Yes Fritzi Mandes, MD  fenofibrate (TRICOR) 145 MG tablet Take 145 mg by mouth daily.   Yes [provider]  furosemide (LASIX) 20 MG tablet Take 20 mg by mouth daily.   Yes [provider]  Multiple Vitamin (MULTIVITAMIN) capsule Take 1 capsule by mouth daily.   Yes [provider]  Multiple Vitamins-Minerals (PRESERVISION AREDS 2+MULTI VIT PO) Take 1 capsule by mouth 2 (two) times daily.   Yes [provider]  vitamin E 400 UNIT capsule Take 1 capsule by mouth daily.   Yes [provider]  nitroGLYCERIN (NITROSTAT) 0.4 MG SL tablet Place 1 tablet under the tongue every 5 (five) minutes as needed. 06/12/16   [provider]    Allergies  as of 12/17/2016 - Review Complete 12/01/2016  Allergen Reaction Noted  . Doxycycline Rash 07/09/2015  . Lisinopril Rash 12/06/2014  . Penicillins Rash 04/18/2015    Family History  Problem Relation Age of Onset  . Hypertension Mother     Social History   Social History  . Marital status: Married    Spouse name: N/A  . Number of children: N/A  . Years of education: N/A   Occupational History  . Not on file.   Social History Main Topics  . Smoking status: Former Smoker    Types: Cigarettes  . Smokeless tobacco: Never Used  . Alcohol use Yes  . Drug use: No  . Sexual activity: Not on file   Other Topics Concern  . Not on file   Social History Narrative  . No narrative on file    Review of Systems: See HPI, otherwise negative ROS  Physical Exam: BP (!) 87/52   Pulse 70   Temp 98.4 F (36.9 C) (Oral)   Resp 17   Ht 5\' 10"  (1.778 m)   Wt 60.9 kg (134 lb 4.8 oz)   SpO2 100%   BMI 19.27 kg/m  General:   Alert,  pleasant and cooperative in NAD Head:  Normocephalic and atraumatic. Neck:  Supple; no masses or thyromegaly. Lungs:  Clear throughout to auscultation, normal respiratory effort.    Heart:  +S1, +S2, Regular rate and rhythm, No edema.  Abdomen:  Soft, nontender and nondistended. Normal bowel sounds, without guarding, and without rebound.   Neurologic:  Alert and  oriented x4;  grossly normal neurologically.  Impression/Plan: [name] is here for an colonoscopy to be performed for surveillance due to prior history of colon polyps   Risks, benefits, limitations, and alternatives regarding  colonoscopy have been reviewed with the patient.  Questions have been answered.  All parties agreeable.   [name], MD  12/12/2016, 11:48 AM

## 2016-12-25 NOTE — Progress Notes (Signed)
[facility]  ROUNDING NOTE   Subjective:  Patient recently admitted to the hospital. This time he comes for severe anemia with hemoglobin as low as 4.8. Patient status post transfusion and hemoglobin currently 8.2. He is developed acute renal failure also with a creatinine of 2.76. When we saw him in the office on 12/17/16 his creatinine was down to 2.2.    Objective:  Vital signs in last 24 hours:  Temp:  [96.2 F (35.7 C)-99 F (37.2 C)] 96.2 F (35.7 C) (10/30 1315) Pulse Rate:  [59-76] 67 (10/30 1415) Resp:  [15-31] 21 (10/30 1415) BP: (87-174)/(38-74) 158/61 (10/30 1415) SpO2:  [98 %-100 %] 100 % (10/30 1415) Weight:  [60.9 kg (134 lb 4.8 oz)] 60.9 kg (134 lb 4.8 oz) (10/30 0500)  Weight change:  Filed Weights   12/14/2016 1255 12/07/2016 0500  Weight: 63.5 kg (140 lb) 60.9 kg (134 lb 4.8 oz)    Intake/Output: I/O last 3 completed shifts: In: 500 [P.O.:125; I.V.:20; Blood:580; IV Piggyback:100] Out: 950 [Urine:950]   Intake/Output this shift:  Total I/O In: 750 [I.V.:750] Out: 75 [Blood:75]  Physical Exam: General: NAD, laying in bed  Head: Normocephalic, atraumatic. Moist oral mucosal membranes  Eyes: Anicteric  Neck: Supple, trachea midline  Lungs:  Clear to auscultation, normal effort  Heart: Regular rate and rhythm  Abdomen:  Soft, nontender, BS present  Extremities: no peripheral edema.  Neurologic: Nonfocal, moving all four extremities  Skin: No lesions       Basic Metabolic Panel:  Recent Labs Lab 12/14/2016 1302 12/03/2016 0343  NA 135 137  K 4.6 4.0  CL 104 110  CO2 21* 20*  GLUCOSE 161* 87  BUN 90* 90*  CREATININE 3.00* 2.76*  CALCIUM 8.0* 7.4*    Liver Function Tests:  Recent Labs Lab 11/28/2016 1302  AST 26  ALT 18  ALKPHOS 26*  BILITOT 0.7  PROT 5.8*  ALBUMIN 2.8*   No results for input(s): LIPASE, AMYLASE in the last 168 hours. No results for input(s): AMMONIA in the last 168 hours.  CBC:  Recent Labs Lab  11/26/2016 1212 12/09/2016 1302 12/26/2016 0343  WBC  --  9.1 8.0  NEUTROABS  --  7.1*  --   HGB 4.8* 5.0* 8.2*  HCT  --  15.5* 24.2*  MCV  --  93.1 91.5  PLT  --  521* 335    Cardiac Enzymes:  Recent Labs Lab 12/12/2016 1302  TROPONINI <0.03    BNP: Invalid input(s): POCBNP  CBG:  Recent Labs Lab 12/11/2016 1650 12/14/2016 1148  GLUCAP 116* 101*    Microbiology: No results found for this or any previous visit.  Coagulation Studies: No results for input(s): LABPROT, INR in the last 72 hours.  Urinalysis: No results for input(s): COLORURINE, LABSPEC, PHURINE, GLUCOSEU, HGBUR, BILIRUBINUR, KETONESUR, PROTEINUR, UROBILINOGEN, NITRITE, LEUKOCYTESUR in the last 72 hours.  Invalid input(s): APPERANCEUR    Imaging: No results found.   Medications:    . [MAR Hold] amLODipine  2.5 mg Oral Daily  . [MAR Hold] carvedilol  6.25 mg Oral BID WC  . [MAR Hold] cholecalciferol  2,000 Units Oral Daily  . EPINEPHrine      . EPINEPHrine      . [MAR Hold] feeding supplement (ENSURE ENLIVE)  237 mL Oral TID BM  . [MAR Hold] multivitamin with minerals  1 tablet Oral Daily  . [MAR Hold] multivitamin-lutein  1 capsule Oral BID  . [MAR Hold] pantoprazole  40 mg Intravenous Q12H  . [  MAR Hold] vitamin E  400 Units Oral Daily   [MAR Hold] acetaminophen **OR** [MAR Hold] acetaminophen, [MAR Hold] bisacodyl, EPINEPHrine 1:10,000, 10 mL syringe/NS, 10 mL vial for sclerotherapy inj mixture, [MAR Hold] hydrALAZINE, [MAR Hold] HYDROcodone-acetaminophen, [MAR Hold] ondansetron **OR** [MAR Hold] ondansetron (ZOFRAN) IV, [MAR Hold] polyethylene glycol  Assessment/ Plan:  Mr. Tydus Sanmiguel is a 83 y.o. white male withcongestive heart failure and history of cardiomyopathy with biventricular ICD in place, diabetes mellitus type 2, hypertension, prostate cancer  1. Acute renal failure on chronic kidney disease stage IV:  Recent baseline Cr was 2.2.   -  Creatinine now up to 2.7.  Suspect that this is  related to the acute GI bleed.  ontinue to monitor renal function over the next several days.  No urgent indication for dialysis.  2. Anemia of CKD with acute GI bleed hgb 4.8 at admission.  Patient status post PRBC transfusion. Further plan per gastroenterology.  3. Diabetes mellitus type II with chronic kidney disease: management as per hospitalist.  4. Hypertension: continue amlodipine and carvedilol.   LOS: 1 Kama Cammarano 10/30/20182:27 PM

## 2016-12-25 NOTE — Consult Note (Signed)
Aaron Barnett , MD [address] [place], [place], [place], [address] [address], [place], [place], [place], [address] Phone: [phone]  Fax: [phone]  Consultation  Referring Provider:     No ref. provider found Primary Care Physician:  Tracie Harrier, MD Primary Gastroenterologist:  Dr. Bonna Gains          Reason for Consultation:     Anemia  Date of Admission:  11/29/2016 Date of Consultation:  12/06/2016         HPI:   Aaron Barnett is a 83 y.o. male with acute on chronic anemia on presentation with Aspirin use daily. Pt. Has never had a colonoscopy or EGD in the past and denies any other NSAID use history. He reports LLQ dull abdominal pain intermittently, 3/10, not associated with food or BM, for 2-3 months for which a CT abdomen was done and showed stool burden in his colon. He denies any episodes of active GI bleeding and reports LE weakness for the last few weeks. Was given Epogen X 1 as anemia was felt to be due to his CKD as an outpatient which did not help his symptoms.   Past Medical History:  Diagnosis Date  . CHF (congestive heart failure) ([place])   . Diabetes mellitus without complication ([place])   . HTN (hypertension)   . Prostate cancer ([place])   . Skin cancer     Past Surgical History:  Procedure Laterality Date  . PROSTATECTOMY      Prior to Admission medications   Medication Sig Start Date End Date Taking? Authorizing Provider  amLODipine (NORVASC) 2.5 MG tablet Take 2.5 mg by mouth daily.   Yes [provider]  aspirin 325 MG tablet Take 325 mg by mouth daily.   Yes [provider]  carvedilol (COREG) 6.25 MG tablet Take 6.25 mg by mouth 2 (two) times daily with a meal.   Yes [provider]  Cholecalciferol (VITAMIN D3) 2000 units TABS Take 1 tablet by mouth daily.   Yes [provider]  feeding supplement, ENSURE ENLIVE, (ENSURE ENLIVE) LIQD Take 237 mLs by mouth 3 (three) times daily between meals.  12/11/16  Yes Fritzi Mandes, MD  fenofibrate (TRICOR) 145 MG tablet Take 145 mg by mouth daily.   Yes [provider]  furosemide (LASIX) 20 MG tablet Take 20 mg by mouth daily.   Yes [provider]  Multiple Vitamin (MULTIVITAMIN) capsule Take 1 capsule by mouth daily.   Yes [provider]  Multiple Vitamins-Minerals (PRESERVISION AREDS 2+MULTI VIT PO) Take 1 capsule by mouth 2 (two) times daily.   Yes [provider]  vitamin E 400 UNIT capsule Take 1 capsule by mouth daily.   Yes [provider]  nitroGLYCERIN (NITROSTAT) 0.4 MG SL tablet Place 1 tablet under the tongue every 5 (five) minutes as needed. 06/12/16   [provider]    Family History  Problem Relation Age of Onset  . Hypertension Mother      Social History  Substance Use Topics  . Smoking status: Former Smoker    Types: Cigarettes  . Smokeless tobacco: Never Used  . Alcohol use Yes    Allergies as of 12/09/2016 - Review Complete 12/11/2016  Allergen Reaction Noted  . Doxycycline Rash 07/09/2015  . Lisinopril Rash 12/06/2014  . Penicillins Rash 04/18/2015    Review of Systems:    All systems reviewed and negative except where noted in HPI.   Physical Exam:  Vital signs in  last 24 hours: Temp:  [96.2 F (35.7 C)-99 F (37.2 C)] 96.2 F (35.7 C) (10/30 1315) Pulse Rate:  [59-76] 75 (10/30 1315) Resp:  [15-27] 27 (10/30 1315) BP: (87-167)/(38-65) 167/65 (10/30 1315) SpO2:  [98 %-100 %] 100 % (10/30 1142) Weight:  [60.9 kg (134 lb 4.8 oz)] 60.9 kg (134 lb 4.8 oz) (10/30 0500) Last BM Date: 12/23/16 General:   Pleasant, cooperative in NAD Head:  Normocephalic and atraumatic. Eyes:   No icterus.   Conjunctiva pink. PERRLA. Ears:  Normal auditory acuity. Neck:  Supple; no masses or thyroidomegaly Lungs: Respirations even and unlabored. Lungs clear to auscultation bilaterally.   No wheezes, crackles, or rhonchi.  Heart:  Regular rate and rhythm;  Without  murmur, clicks, rubs or gallops Abdomen:  Soft, nondistended, nontender. Normal bowel sounds. No appreciable masses or hepatomegaly.  No rebound or guarding.  Neurologic:  Alert and oriented x3;  grossly normal neurologically. Skin:  Intact without significant lesions or rashes. Cervical Nodes:  No significant cervical adenopathy. Psych:  Alert and cooperative. Normal affect.  LAB RESULTS:  Recent Labs  12/11/2016 1212 12/17/2016 1302 12/03/2016 0343  WBC  --  9.1 8.0  HGB 4.8* 5.0* 8.2*  HCT  --  15.5* 24.2*  PLT  --  521* 335   BMET  Recent Labs  12/22/2016 1302 12/12/2016 0343  NA 135 137  K 4.6 4.0  CL 104 110  CO2 21* 20*  GLUCOSE 161* 87  BUN 90* 90*  CREATININE 3.00* 2.76*  CALCIUM 8.0* 7.4*   LFT  Recent Labs  12/23/2016 1302  PROT 5.8*  ALBUMIN 2.8*  AST 26  ALT 18  ALKPHOS 26*  BILITOT 0.7   PT/INR No results for input(s): LABPROT, INR in the last 72 hours.  STUDIES: No results found.    Impression / Plan:   [name] is a 83 y.o. y/o male with anemia noted at the hematologists office the day of presentation with Hgb of 4.8 and pt. Was sent to the ER with no signs of active GI bleeding prior to presentation and history of ASA 325mg  daily at home  Acute anemia -Acute drop in Hgb from baseline and will need further evaluation -Will start with EGD today to evaluate for PUD given Aspirin use, denies any other NSAID use -If negative can discuss colonoscopy as pt has never had one before -Pt and wife agreeable to the plan -Continue Protonix 40mg  IV BID -serial CBCs and Transfuse PRN -See EGD report for details, findings and other recommendations post procedure  Thank you for involving me in the care of this patient.      LOS: 1 day   [name], MD  11/27/2016, 1:28 PM

## 2016-12-25 NOTE — Anesthesia Preprocedure Evaluation (Signed)
Anesthesia Evaluation  Patient identified by MRN, date of birth, ID band Patient awake    Reviewed: Allergy & Precautions, NPO status , Patient's Chart, lab work & pertinent test results  History of Anesthesia Complications Negative for: history of anesthetic complications  Airway Mallampati: III       Dental  (+) Edentulous Upper, Edentulous Lower   Pulmonary neg sleep apnea, neg COPD, former smoker,           Cardiovascular hypertension, +CHF (EF 50%)       Neuro/Psych neg Seizures    GI/Hepatic Neg liver ROS, neg GERD  ,  Endo/Other  diabetes, Type 2, Oral Hypoglycemic Agents, Insulin Dependent  Renal/GU Renal InsufficiencyRenal disease     Musculoskeletal   Abdominal   Peds  Hematology  (+) anemia ,   Anesthesia Other Findings   Reproductive/Obstetrics                             Anesthesia Physical Anesthesia Plan  ASA: III  Anesthesia Plan: General   Post-op Pain Management:    Induction: Intravenous  PONV Risk Score and Plan: Propofol infusion  Airway Management Planned: Nasal Cannula  Additional Equipment:   Intra-op Plan:   Post-operative Plan:   Informed Consent: I have reviewed the patients History and Physical, chart, labs and discussed the procedure including the risks, benefits and alternatives for the proposed anesthesia with the patient or authorized representative who has indicated his/her understanding and acceptance.     Plan Discussed with:   Anesthesia Plan Comments:         Anesthesia Quick Evaluation

## 2016-12-25 NOTE — Anesthesia Postprocedure Evaluation (Signed)
Anesthesia Post Note  Patient: Aaron Barnett  Procedure(s) Performed: ESOPHAGOGASTRODUODENOSCOPY (EGD) WITH PROPOFOL (N/A )  Patient location during evaluation: Endoscopy Anesthesia Type: General Level of consciousness: awake and alert Pain management: pain level controlled Vital Signs Assessment: post-procedure vital signs reviewed and stable Respiratory status: spontaneous breathing, nonlabored ventilation, respiratory function stable and patient connected to nasal cannula oxygen Cardiovascular status: blood pressure returned to baseline and stable Postop Assessment: no apparent nausea or vomiting Anesthetic complications: no     Last Vitals:  Vitals:   12/14/2016 1425 12/16/2016 1456  BP: (!) 151/67 (!) 136/54  Pulse: 65 68  Resp: 17 18  Temp:  36.7 C  SpO2: 99% 98%    Last Pain:  Vitals:   11/27/2016 1456  TempSrc: Oral  PainSc: 0-No pain                 Martha Clan

## 2016-12-25 NOTE — Progress Notes (Signed)
PT Cancellation Note:  Pt still off floor for procedure.  Will re-attempt PT eval tomorrow.  Leitha Bleak, PT 12/24/2016, 2:43 PM [phone]

## 2016-12-25 NOTE — Consult Note (Signed)
Name: Aaron Barnett MRN: [id number] DOB: 09-Dec-1927    ADMISSION DATE:  12/14/2016  CONSULTATION DATE:  12/02/2016  REFERRING MD : Dr. Manuella Ghazi  CHIEF COMPLAINT:  Anemia  BRIEF PATIENT DESCRIPTION: 83 year old male with Anemia s/p esophago gastro duodenoscopy ,placed in SDU for observation   STUDIES:  10/30 /10 Upper endoscopy>> Pl. Refer to result review   HISTORY OF PRESENT ILLNESS:  Aaron Barnett is an 83 year old male with known history of CHF,HTN,DM and  Acute on chronic Anemia,CKD stage -III. Patient uses aspirin daily and denies any NSAID used.  Patient had 2 units of blood transfused and had had esophagogastroduodenoscopy.  Post procedure she as sent to the sdu for observation  PAST MEDICAL HISTORY :   has a past medical history of CHF (congestive heart failure) ([place]); Diabetes mellitus without complication ([place]); HTN (hypertension); Prostate cancer ([place]); and Skin cancer.  has a past surgical history that includes Prostatectomy. Prior to Admission medications   Medication Sig Start Date End Date Taking? Authorizing Provider  amLODipine (NORVASC) 2.5 MG tablet Take 2.5 mg by mouth daily.   Yes [provider]  aspirin 325 MG tablet Take 325 mg by mouth daily.   Yes [provider]  carvedilol (COREG) 6.25 MG tablet Take 6.25 mg by mouth 2 (two) times daily with a meal.   Yes [provider]  Cholecalciferol (VITAMIN D3) 2000 units TABS Take 1 tablet by mouth daily.   Yes [provider]  feeding supplement, ENSURE ENLIVE, (ENSURE ENLIVE) LIQD Take 237 mLs by mouth 3 (three) times daily between meals. 12/11/16  Yes Fritzi Mandes, MD  fenofibrate (TRICOR) 145 MG tablet Take 145 mg by mouth daily.   Yes [provider]  furosemide (LASIX) 20 MG tablet Take 20 mg by mouth daily.   Yes [provider]  Multiple Vitamin (MULTIVITAMIN) capsule Take 1 capsule by mouth daily.   Yes [provider]  Multiple Vitamins-Minerals  (PRESERVISION AREDS 2+MULTI VIT PO) Take 1 capsule by mouth 2 (two) times daily.   Yes [provider]  vitamin E 400 UNIT capsule Take 1 capsule by mouth daily.   Yes [provider]  nitroGLYCERIN (NITROSTAT) 0.4 MG SL tablet Place 1 tablet under the tongue every 5 (five) minutes as needed. 06/12/16   [provider]   Allergies  Allergen Reactions  . Doxycycline Rash  . Lisinopril Rash  . Penicillins Rash    Has patient had a PCN reaction causing immediate rash, facial/tongue/throat swelling, SOB or lightheadedness with hypotension: Yes Has patient had a PCN reaction causing severe rash involving mucus membranes or skin necrosis: No Has patient had a PCN reaction that required hospitalization: No Has patient had a PCN reaction occurring within the last 10 years: No If all of the above answers are "NO", then may proceed with Cephalosporin use.    FAMILY HISTORY:  family history includes Hypertension in his mother. SOCIAL HISTORY:  reports that he has quit smoking. His smoking use included Cigarettes. He has never used smokeless tobacco. He reports that he drinks alcohol. He reports that he does not use drugs.  REVIEW OF SYSTEMS:   Constitutional: Negative for fever, chills, weight loss, malaise/fatigue and diaphoresis.  HENT: Negative for hearing loss, ear pain, nosebleeds, congestion, sore throat, neck pain, tinnitus and ear discharge.   Eyes: Negative for blurred vision, double vision, photophobia, pain, discharge and redness.  Respiratory: Negative for cough, hemoptysis, sputum production, shortness of breath, wheezing and  stridor.   Cardiovascular: Negative for chest pain, palpitations, orthopnea, claudication, leg swelling and PND.  Gastrointestinal: Negative for heartburn, nausea, vomiting, abdominal pain, diarrhea, constipation, blood in stool and melena.  Genitourinary: Negative for dysuria, urgency, frequency, hematuria and flank pain.    Musculoskeletal: Negative for myalgias, back pain, joint pain and falls.  Skin: Negative for itching and rash.  Neurological: Negative for dizziness, tingling, tremors, sensory change, speech change, focal weakness, seizures, loss of consciousness, weakness and headaches.  Endo/Heme/Allergies: Negative for environmental allergies and polydipsia. Does not bruise/bleed easily.  SUBJECTIVE: Patient states "that he is feeling fine"  VITAL SIGNS: Temp:  [96.2 F (35.7 C)-99 F (37.2 C)] 98.6 F (37 C) (10/30 1945) Pulse Rate:  [59-76] 76 (10/30 1800) Resp:  [17-31] 17 (10/30 1800) BP: (87-174)/(38-95) 133/95 (10/30 1800) SpO2:  [93 %-100 %] 93 % (10/30 1800) Weight:  [132 lb 4.4 oz (60 kg)-134 lb 4.8 oz (60.9 kg)] 132 lb 4.4 oz (60 kg) (10/30 1722)  PHYSICAL EXAMINATION: General:  Elderly,caucasian male, in no acute distress Neuro:  Awake,alert and oriented HEENT: AT,[place],No jvd Cardiovascular:  S1s2,regular, no m/r/g Lungs:  Clear bilaterally, no wheezes,crackles,rhonchi Abdomen:  Soft,nontender Musculoskeletal: no edema,cyanosis Skin:warm,dry and intact   Recent Labs Lab 12/23/2016 1302 12/07/2016 0343  NA 135 137  K 4.6 4.0  CL 104 110  CO2 21* 20*  BUN 90* 90*  CREATININE 3.00* 2.76*  GLUCOSE 161* 87    Recent Labs Lab 12/19/2016 1212 12/08/2016 1302 12/18/2016 0343  HGB 4.8* 5.0* 8.2*  HCT  --  15.5* 24.2*  WBC  --  9.1 8.0  PLT  --  521* 335   No results found.  ASSESSMENT / PLAN:  Acute on chronic Anemia Acute on chronic kidney failure Hx of CHF/HTN/DM   PLAN -Transfused 2 units of PRBC'S(10/30) -s/p EGD One non-obstructing with adhererent clot duodenal ulcer with adherent clot. There is no evidence of perforation - Avoid Nephrotoxic drugs -Continue Coreg -Continue Norvasc and Coreg - Monitor CBC - Continue Protonix   Bincy Varughese,AG-ACNP  Pulmonary and Critical Care Medicine [facility]   12/25/2016, 8:52 PM   PCCM ATTENDING  ATTESTATION: I have evaluated patient with the APP, reviewed database in its entirety and discussed care plan in detail. In addition, this patient was discussed on multidisciplinary rounds. I agree with the above findings, assessment and plan  [name], MD PCCM service Mobile [phone] Pager [phone] 12/26/2016 11:56 AM

## 2016-12-25 NOTE — Progress Notes (Signed)
PT Cancellation note:  PT consult received.  Chart reviewed.  Pt currently off floor for procedure.  Will re-attempt PT eval at a later date/time.  Leitha Bleak, PT 12/10/2016, 11:43 AM [phone]

## 2016-12-25 NOTE — Progress Notes (Signed)
[facility] [place] NAME: Aaron Barnett    MR#:  [id number]  DATE OF BIRTH:  [birth year]  SUBJECTIVE:  CHIEF COMPLAINT:   Chief Complaint  Patient presents with  . Abnormal Lab  looks very pale, feels tired, wife at bedside. REVIEW OF SYSTEMS:  Review of Systems  Constitutional: Positive for malaise/fatigue. Negative for chills, fever and weight loss.  HENT: Negative for nosebleeds and sore throat.   Eyes: Negative for blurred vision.  Respiratory: Negative for cough, shortness of breath and wheezing.   Cardiovascular: Negative for chest pain, orthopnea, leg swelling and PND.  Gastrointestinal: Negative for abdominal pain, constipation, diarrhea, heartburn, nausea and vomiting.  Genitourinary: Negative for dysuria and urgency.  Musculoskeletal: Negative for back pain.  Skin: Negative for rash.  Neurological: Positive for weakness. Negative for dizziness, speech change, focal weakness and headaches.  Endo/Heme/Allergies: Does not bruise/bleed easily.  Psychiatric/Behavioral: Negative for depression.    DRUG ALLERGIES:   Allergies  Allergen Reactions  . Doxycycline Rash  . Lisinopril Rash  . Penicillins Rash    Has patient had a PCN reaction causing immediate rash, facial/tongue/throat swelling, SOB or lightheadedness with hypotension: Yes Has patient had a PCN reaction causing severe rash involving mucus membranes or skin necrosis: No Has patient had a PCN reaction that required hospitalization: No Has patient had a PCN reaction occurring within the last 10 years: No If all of the above answers are "NO", then may proceed with Cephalosporin use.   VITALS:  Blood pressure (!) 133/95, pulse 76, temperature 98.6 F (37 C), temperature source Oral, resp. rate 17, height 5\' 10"  (1.778 m), weight 60 kg (132 lb 4.4 oz), SpO2 93 %. PHYSICAL EXAMINATION:  Physical Exam  Constitutional: He is oriented to person, place, and time and  well-developed, well-nourished, and in no distress. He appears dehydrated. He appears unhealthy. He has a sickly appearance.  HENT:  Head: Normocephalic and atraumatic.  Eyes: Pupils are equal, round, and reactive to light. Conjunctivae and EOM are normal.  Neck: Normal range of motion. Neck supple. No tracheal deviation present. No thyromegaly present.  Cardiovascular: Normal rate, regular rhythm and normal heart sounds.   Pulmonary/Chest: Effort normal and breath sounds normal. No respiratory distress. He has no wheezes. He exhibits no tenderness.  Abdominal: Soft. Bowel sounds are normal. He exhibits no distension. There is no tenderness.  Musculoskeletal: Normal range of motion.  Neurological: He is alert and oriented to person, place, and time. No cranial nerve deficit.  Skin: Skin is warm and dry. No rash noted. There is pallor.  Psychiatric: Mood and affect normal.   LABORATORY PANEL:  Male CBC  Recent Labs Lab 12/24/2016 0343  WBC 8.0  HGB 8.2*  HCT 24.2*  PLT 335   ------------------------------------------------------------------------------------------------------------------ Chemistries   Recent Labs Lab 12/12/2016 1302 11/26/2016 0343  NA 135 137  K 4.6 4.0  CL 104 110  CO2 21* 20*  GLUCOSE 161* 87  BUN 90* 90*  CREATININE 3.00* 2.76*  CALCIUM 8.0* 7.4*  AST 26  --   ALT 18  --   ALKPHOS 26*  --   BILITOT 0.7  --    RADIOLOGY:  No results found. ASSESSMENT AND PLAN:  83 year old male with history of chronic systolic heart failure ejection fraction 50%, relative cardiomyopathy with AICD and chronic kidney disease stage III admitted with symptomatic anemia.  1.  Symptomatic acute on chronic anemia: - S/p 2 PRBC transfusion - s/p EGD  One non-obstructing with adhererent clot duodenal ulcer with adherent clot. There is no evidence of perforation. - GI recommends monitoring in ICU. No asa for now. - continue protonix  2.  Acute on chronic kidney failure  stage IV Nephrology following. Stop nephrotoxic medications  4.  Dilated cardiomyopathy: Continue Coreg  5.  Essential hypertension: Continue Norvasc and Coreg, adjust as need     All the records are reviewed and case discussed with Care Management/Social Worker. Management plans discussed with the patient, family (wife at bedside) and they are in agreement.  CODE STATUS: Full Code  TOTAL TIME TAKING CARE OF THIS PATIENT: 35 minutes.   More than 50% of the time was spent in counseling/coordination of care: YES  POSSIBLE D/C IN 1-2 DAYS, DEPENDING ON CLINICAL CONDITION.   Max Sane M.D on 12/12/2016 at 8:56 PM  Between 7am to 6pm - Pager - [phone]  After 6pm go to [email or web address] - Proofreader  [facility]  Office  [phone]  CC: Primary care physician; Tracie Harrier, MD  Note: This dictation was prepared with Dragon dictation along with smaller phrase technology. Any transcriptional errors that result from this process are unintentional.

## 2016-12-25 NOTE — Transfer of Care (Signed)
Immediate Anesthesia Transfer of Care Note  Patient: Aaron Barnett  Procedure(s) Performed: ESOPHAGOGASTRODUODENOSCOPY (EGD) WITH PROPOFOL (N/A )  Patient Location: Endoscopy Unit  Anesthesia Type:General  Level of Consciousness: drowsy and patient cooperative  Airway & Oxygen Therapy: Patient Spontanous Breathing and Patient connected to nasal cannula oxygen  Post-op Assessment: Report given to RN and Post -op Vital signs reviewed and stable  Post vital signs: Reviewed and stable  Last Vitals:  Vitals:   12/22/2016 1142 12/16/2016 1315  BP: (!) 87/52   Pulse: 70 (P) 75  Resp: 17   Temp:  (!) (P) 35.7 C  SpO2: 100%     Last Pain:  Vitals:   12/04/2016 1315  TempSrc: (P) Tympanic  PainSc:          Complications: No apparent anesthesia complications

## 2016-12-25 NOTE — Anesthesia Post-op Follow-up Note (Signed)
Anesthesia QCDR form completed.        

## 2016-12-26 ENCOUNTER — Encounter: Payer: Self-pay | Admitting: Gastroenterology

## 2016-12-26 DIAGNOSIS — R195 Other fecal abnormalities: Secondary | ICD-10-CM

## 2016-12-26 DIAGNOSIS — K922 Gastrointestinal hemorrhage, unspecified: Secondary | ICD-10-CM

## 2016-12-26 DIAGNOSIS — T39395A Adverse effect of other nonsteroidal anti-inflammatory drugs [NSAID], initial encounter: Secondary | ICD-10-CM

## 2016-12-26 DIAGNOSIS — K279 Peptic ulcer, site unspecified, unspecified as acute or chronic, without hemorrhage or perforation: Secondary | ICD-10-CM

## 2016-12-26 DIAGNOSIS — Z7189 Other specified counseling: Secondary | ICD-10-CM

## 2016-12-26 DIAGNOSIS — K622 Anal prolapse: Secondary | ICD-10-CM

## 2016-12-26 DIAGNOSIS — Z515 Encounter for palliative care: Secondary | ICD-10-CM

## 2016-12-26 DIAGNOSIS — E119 Type 2 diabetes mellitus without complications: Secondary | ICD-10-CM

## 2016-12-26 DIAGNOSIS — I10 Essential (primary) hypertension: Secondary | ICD-10-CM

## 2016-12-26 DIAGNOSIS — N185 Chronic kidney disease, stage 5: Secondary | ICD-10-CM

## 2016-12-26 LAB — BASIC METABOLIC PANEL
Anion gap: 5 (ref 5–15)
BUN: 94 mg/dL — AB (ref 6–20)
CALCIUM: 7.5 mg/dL — AB (ref 8.9–10.3)
CO2: 21 mmol/L — ABNORMAL LOW (ref 22–32)
CREATININE: 2.52 mg/dL — AB (ref 0.61–1.24)
Chloride: 113 mmol/L — ABNORMAL HIGH (ref 101–111)
GFR, EST AFRICAN AMERICAN: 25 mL/min — AB (ref >60)
GFR, EST NON AFRICAN AMERICAN: 21 mL/min — AB (ref >60)
Glucose, Bld: 128 mg/dL — ABNORMAL HIGH (ref 65–99)
Potassium: 4.5 mmol/L (ref 3.5–5.1)
SODIUM: 139 mmol/L (ref 135–145)

## 2016-12-26 LAB — HEMOGLOBIN AND HEMATOCRIT, BLOOD
HCT: 24.4 % — ABNORMAL LOW (ref 40.0–52.0)
HCT: 23 % — ABNORMAL LOW (ref 40.0–52.0)
Hemoglobin: 8.2 g/dL — ABNORMAL LOW (ref 13.0–18.0)
Hemoglobin: 7.9 g/dL — ABNORMAL LOW (ref 13.0–18.0)

## 2016-12-26 LAB — CBC
HCT: 17.6 % — ABNORMAL LOW (ref 40.0–52.0)
Hemoglobin: 6.1 g/dL — ABNORMAL LOW (ref 13.0–18.0)
MCH: 31.9 pg (ref 26.0–34.0)
MCHC: 34.4 g/dL (ref 32.0–36.0)
MCV: 92.9 fL (ref 80.0–100.0)
PLATELETS: 294 10*3/uL (ref 150–440)
RBC: 1.9 MIL/uL — ABNORMAL LOW (ref 4.40–5.90)
RDW: 18.3 % — ABNORMAL HIGH (ref 11.5–14.5)
WBC: 8.2 10*3/uL (ref 3.8–10.6)

## 2016-12-26 LAB — PREPARE RBC (CROSSMATCH)

## 2016-12-26 LAB — MAGNESIUM: MAGNESIUM: 2 mg/dL (ref 1.7–2.4)

## 2016-12-26 MED ORDER — PANTOPRAZOLE SODIUM 40 MG IV SOLR
40.0000 mg | Freq: Two times a day (BID) | INTRAVENOUS | Status: DC
  Administered 2016-12-26 – 2016-12-29 (×8): 40 mg via INTRAVENOUS
  Filled 2016-12-26 (×9): qty 40

## 2016-12-26 MED ORDER — SODIUM CHLORIDE 0.9 % IV BOLUS (SEPSIS)
250.0000 mL | Freq: Once | INTRAVENOUS | Status: AC
  Administered 2016-12-26: 250 mL via INTRAVENOUS

## 2016-12-26 MED ORDER — SODIUM CHLORIDE 0.9 % IV SOLN: Freq: Once | INTRAVENOUS | Status: DC

## 2016-12-26 NOTE — Progress Notes (Signed)
[facility]  ROUNDING NOTE   Subjective:  Patient moved over to critical care unit. Hemoglobin dropped to 6.1 this a.m.  he is currently receiving blood transfusion. Creatinine down to 2.5.    Objective:  Vital signs in last 24 hours:  Temp:  [96.2 F (35.7 C)-98.6 F (37 C)] 97.8 F (36.6 C) (10/31 0840) Pulse Rate:  [60-83] 68 (10/31 0840) Resp:  [17-31] 19 (10/31 0840) BP: (87-174)/(39-95) 105/64 (10/31 0840) SpO2:  [93 %-100 %] 100 % (10/31 0840) Weight:  [60 kg (132 lb 4.4 oz)] 60 kg (132 lb 4.4 oz) (10/30 1722)  Weight change: -3.503 kg (-7 lb 11.6 oz) Filed Weights   12/02/2016 1255 12/04/2016 0500 12/03/2016 1722  Weight: 63.5 kg (140 lb) 60.9 kg (134 lb 4.8 oz) 60 kg (132 lb 4.4 oz)    Intake/Output: I/O last 3 completed shifts: In: 4431 [P.O.:125; I.V.:770; Blood:580] Out: 1325 [Urine:1250; Blood:75]   Intake/Output this shift:  No intake/output data recorded.  Physical Exam: General: NAD, laying in bed  Head: Normocephalic, atraumatic. Moist oral mucosal membranes  Eyes: Anicteric  Neck: Supple, trachea midline  Lungs:  Clear to auscultation, normal effort  Heart: S1S2 no rubs  Abdomen:  Soft, nontender, BS present  Extremities: no peripheral edema.  Neurologic: Nonfocal, moving all four extremities  Skin: No lesions       Basic Metabolic Panel:  Recent Labs Lab 12/16/2016 1302 12/18/2016 0343 12/26/16 0441  NA 135 137 139  K 4.6 4.0 4.5  CL 104 110 113*  CO2 21* 20* 21*  GLUCOSE 161* 87 128*  BUN 90* 90* 94*  CREATININE 3.00* 2.76* 2.52*  CALCIUM 8.0* 7.4* 7.5*  MG  --   --  2.0    Liver Function Tests:  Recent Labs Lab 12/23/2016 1302  AST 26  ALT 18  ALKPHOS 26*  BILITOT 0.7  PROT 5.8*  ALBUMIN 2.8*   No results for input(s): LIPASE, AMYLASE in the last 168 hours. No results for input(s): AMMONIA in the last 168 hours.  CBC:  Recent Labs Lab 12/01/2016 1212 12/10/2016 1302 12/24/2016 0343 12/26/16 0441  WBC  --  9.1  8.0 8.2  NEUTROABS  --  7.1*  --   --   HGB 4.8* 5.0* 8.2* 6.1*  HCT  --  15.5* 24.2* 17.6*  MCV  --  93.1 91.5 92.9  PLT  --  521* 335 294    Cardiac Enzymes:  Recent Labs Lab 12/01/2016 1302  TROPONINI <0.03    BNP: Invalid input(s): POCBNP  CBG:  Recent Labs Lab 11/26/2016 1650 12/18/2016 1148 12/24/2016 1716  GLUCAP 116* 101* 115*    Microbiology: No results found for this or any previous visit.  Coagulation Studies: No results for input(s): LABPROT, INR in the last 72 hours.  Urinalysis: No results for input(s): COLORURINE, LABSPEC, PHURINE, GLUCOSEU, HGBUR, BILIRUBINUR, KETONESUR, PROTEINUR, UROBILINOGEN, NITRITE, LEUKOCYTESUR in the last 72 hours.  Invalid input(s): APPERANCEUR    Imaging: No results found.   Medications:   . sodium chloride     . cholecalciferol  2,000 Units Oral Daily  . feeding supplement (ENSURE ENLIVE)  237 mL Oral TID BM  . multivitamin-lutein  1 capsule Oral BID  . pantoprazole  40 mg Intravenous Q12H  . vitamin E  400 Units Oral Daily   acetaminophen **OR** acetaminophen, bisacodyl, HYDROcodone-acetaminophen, ondansetron **OR** ondansetron (ZOFRAN) IV, polyethylene glycol  Assessment/ Plan:  Mr. Blayze Haen is a 83 y.o. white male withcongestive heart failure and  history of cardiomyopathy with biventricular ICD in place, diabetes mellitus type 2, hypertension, prostate cancer  1. Acute renal failure on chronic kidney disease stage IV:  Recent baseline Cr was 2.2.   -  Creatinine down to 2.5. BUN still elevated at 94. Suspect that BUN is out of proportion to creatinine secondary to GI bleed. No urgent indication for dialysis as creatinine is coming down. Continue to monitor renal parameters daily.  2. Anemia of CKD with acute GI bleed hgb 4.8 at admission.   - Hemoglobin dropped to 6.1 today. Agree with blood transfusion. Duodenal ulcer noted on EGD.  3. Diabetes mellitus type II with chronic kidney disease: management as per  hospitalist.  4. Hypertension: Amlodipine and carvedilol stopped secondary to GI bleed.   LOS: 2 Shawn Dannenberg 10/31/201810:08 AM

## 2016-12-26 NOTE — Progress Notes (Signed)
PT Cancellation Note  Patient Details Name: Aaron Barnett MRN: [id number] DOB: 07-31-1927   Cancelled Treatment:    Reason Eval/Treat Not Completed: Medical issues which prohibited therapy.  Pt transferred to CCU 11/30/2016.  Hgb noted to be 6.1 this morning.  D/t pt transferring to higher level of care, per PT protocol require new PT consult in order to continue therapy (will discontinue current PT order d/t this).  Please re-consult PT when pt is medically appropriate to participate in PT.  Leitha Bleak, PT 12/26/16, 8:04 AM [phone]

## 2016-12-26 NOTE — Progress Notes (Signed)
   [name] , MD [address], [place], [place], [place], [address] [address], [place], [place], [place], [address] Phone: [phone]  Fax: [phone]   Aaron Barnett is being followed for anemia Day 2 of follow up   Subjective: Pt. Remains hemodynamically stable. Had 2 episodes of blood in stool last night and no BM today. Hgb was checked this morning since the last check 24 hrs prior and was low today at 6.1 and pt has received PRBC tranfusion.    Objective: Vital signs in last 24 hours: Vitals:   12/26/16 1000 12/26/16 1100 12/26/16 1200 12/26/16 1300  BP: 101/65 104/62 106/68 124/65  Pulse: 76 62 61 65  Resp: 19 15 17  (!) 22  Temp:  97.9 F (36.6 C)    TempSrc:  Oral    SpO2: 100% 99% 100% 99%  Weight:      Height:       Weight change: -3.503 kg (-7 lb 11.6 oz)  Intake/Output Summary (Last 24 hours) at 12/26/16 1403 Last data filed at 12/26/16 1100  Gross per 24 hour  Intake              310 ml  Output              300 ml  Net               10 ml     Exam: Heart:: Regular rate and rhythm, S1S2 present or without murmur or extra heart sounds Lungs: normal and clear to auscultation Abdomen: soft, nontender, normal bowel sounds   Lab Results: @LABTEST2 @ Micro Results: No results found for this or any previous visit (from the past 240 hour(s)). Studies/Results: No results found. Medications: I have reviewed the patient's current medications. Scheduled Meds: . cholecalciferol  2,000 Units Oral Daily  . feeding supplement (ENSURE ENLIVE)  237 mL Oral TID BM  . multivitamin-lutein  1 capsule Oral BID  . pantoprazole  40 mg Intravenous Q12H  . vitamin E  400 Units Oral Daily   Continuous Infusions: . sodium chloride     PRN Meds:.acetaminophen **OR** acetaminophen, bisacodyl, HYDROcodone-acetaminophen, ondansetron **OR** ondansetron (ZOFRAN) IV, polyethylene glycol   Assessment: Active Problems:   Anemia   Occult GI bleeding   Advance  care planning   Palliative care by specialist PUD   Plan: Patient had an EGD yesterday that showed a large duodenal ulcer with adherent clot that was treated with successful hemostasis Patient's bloody BM yesterday night with decrease in Hgb this morning, but stable hemodynamic stable likely represents old blood from ulcer bleeding during the procedure yesterday instead of new bleeding. However, he needs to be monitored closely to ensure bleeding from ulcer site has stopped and if he continues to have active GI bleeding he will need vascular surgery or IR eval to discuss embolization.  Continue Serial CBCs and transfuse PRN. Would recommend CBC check post transfusion today and later in the evening again today and again tomorrow morning. Change frequency of serial CBCs as needed depending on clinical course and lab findings Avoid NSAIDs Continue PPI IV BID Consult Cardiology regarding need for continuing Aspirin at high dose before discharge.     LOS: 2 days   [name], MD 12/26/2016, 2:03 PM

## 2016-12-26 NOTE — Progress Notes (Signed)
[name] NAME: Aaron Barnett    MR#:  [id number]  DATE OF BIRTH:  1927/03/02  SUBJECTIVE:  CHIEF COMPLAINT:   Chief Complaint  Patient presents with  . Abnormal Lab  Had 2 episodes of blood in the stool last night, hemoglobin 6.1.  1 unit of packed red blood cell given today REVIEW OF SYSTEMS:  Review of Systems  Constitutional: Positive for malaise/fatigue. Negative for chills, fever and weight loss.  HENT: Negative for nosebleeds and sore throat.   Eyes: Negative for blurred vision.  Respiratory: Negative for cough, shortness of breath and wheezing.   Cardiovascular: Negative for chest pain, orthopnea, leg swelling and PND.  Gastrointestinal: Negative for abdominal pain, constipation, diarrhea, heartburn, nausea and vomiting.  Genitourinary: Negative for dysuria and urgency.  Musculoskeletal: Negative for back pain.  Skin: Negative for rash.  Neurological: Positive for weakness. Negative for dizziness, speech change, focal weakness and headaches.  Endo/Heme/Allergies: Does not bruise/bleed easily.  Psychiatric/Behavioral: Negative for depression.   DRUG ALLERGIES:   Allergies  Allergen Reactions  . Doxycycline Rash  . Lisinopril Rash  . Penicillins Rash    Has patient had a PCN reaction causing immediate rash, facial/tongue/throat swelling, SOB or lightheadedness with hypotension: Yes Has patient had a PCN reaction causing severe rash involving mucus membranes or skin necrosis: No Has patient had a PCN reaction that required hospitalization: No Has patient had a PCN reaction occurring within the last 10 years: No If all of the above answers are "NO", then may proceed with Cephalosporin use.   VITALS:  Blood pressure 107/64, pulse 62, temperature 97.9 F (36.6 C), temperature source Oral, resp. rate 20, height 5\' 10"  (1.778 m), weight 60 kg (132 lb 4.4 oz), SpO2 100 %. PHYSICAL EXAMINATION:  Physical Exam    Constitutional: He is oriented to person, place, and time and well-developed, well-nourished, and in no distress. He appears dehydrated. He appears unhealthy. He has a sickly appearance.  HENT:  Head: Normocephalic and atraumatic.  Eyes: Pupils are equal, round, and reactive to light. Conjunctivae and EOM are normal.  Neck: Normal range of motion. Neck supple. No tracheal deviation present. No thyromegaly present.  Cardiovascular: Normal rate, regular rhythm and normal heart sounds.   Pulmonary/Chest: Effort normal and breath sounds normal. No respiratory distress. He has no wheezes. He exhibits no tenderness.  Abdominal: Soft. Bowel sounds are normal. He exhibits no distension. There is no tenderness.  Musculoskeletal: Normal range of motion.  Neurological: He is alert and oriented to person, place, and time. No cranial nerve deficit.  Skin: Skin is warm and dry. No rash noted. There is pallor.  Psychiatric: Mood and affect normal.   LABORATORY PANEL:  Male CBC  Recent Labs Lab 12/26/16 0441 12/26/16 1333  WBC 8.2  --   HGB 6.1* 8.2*  HCT 17.6* 24.4*  PLT 294  --    ------------------------------------------------------------------------------------------------------------------ Chemistries   Recent Labs Lab 12/23/2016 1302  12/26/16 0441  NA 135  < > 139  K 4.6  < > 4.5  CL 104  < > 113*  CO2 21*  < > 21*  GLUCOSE 161*  < > 128*  BUN 90*  < > 94*  CREATININE 3.00*  < > 2.52*  CALCIUM 8.0*  < > 7.5*  MG  --   --  2.0  AST 26  --   --   ALT 18  --   --   ALKPHOS  26*  --   --   BILITOT 0.7  --   --   < > = values in this interval not displayed. RADIOLOGY:  No results found. ASSESSMENT AND PLAN:  83 year old male with history of chronic systolic heart failure ejection fraction 50%, relative cardiomyopathy with AICD and chronic kidney disease stage III admitted with symptomatic anemia.  1.  Symptomatic acute on chronic anemia: - S/p 3 PRBC transfusion - s/p EGD One  non-obstructing with adhererent clot duodenal ulcer with adherent clot. There is no evidence of perforation. - GI recommends monitoring in ICU. No asa for now.  GI recommends getting opinion of cardiology for ongoing need of aspirin - continue protonix - he needs to be monitored closely to ensure bleeding from ulcer site has stopped and if he continues to have active GI bleeding he will need vascular surgery or IR eval to discuss embolization.  Appreciate vascular surgery input - Avoid NSAIDs  2.  Acute on chronic kidney failure stage IV Nephrology following. Stop nephrotoxic medications  4.  Dilated cardiomyopathy: Continue Coreg -Cardiology consultation per GI request decide need for ongoing aspirin on discharge  5.  Essential hypertension: Continue Norvasc and Coreg, adjust as need     All the records are reviewed and case discussed with Care Management/Social Worker. Management plans discussed with the patient, nursing, Dr. Alva Garnet and they are in agreement.  CODE STATUS: Full Code  TOTAL TIME TAKING CARE OF THIS PATIENT: 35 minutes.   More than 50% of the time was spent in counseling/coordination of care: YES  POSSIBLE D/C IN 1-2 DAYS, DEPENDING ON CLINICAL CONDITION.   Max Sane M.D on 12/26/2016 at 4:45 PM  Between 7am to 6pm - Pager - [phone]  After 6pm go to [email or web address] - Proofreader  [facility]  Office  [phone]  CC: Primary care physician; Tracie Harrier, MD  Note: This dictation was prepared with Dragon dictation along with smaller phrase technology. Any transcriptional errors that result from this process are unintentional.

## 2016-12-26 NOTE — Consult Note (Signed)
[facility] Vascular Consult Note  MRN : [id number]  Aaron Barnett is a 83 y.o. (1927-05-11) male who presents with chief complaint of  Chief Complaint  Patient presents with  . Abnormal Lab  .  History of Present Illness: I am asked to see the patient by Dr. Manuella Ghazi for GI bleeding.  It sounds like the patient had some slow degree of bleeding for several weeks, but over the last few days this became critical.  He was admitted with profound anemia.  Ultimately, he was found to have a brisk upper GI bleed with a large duodenal ulcer and a visible vessel that was treated endoscopically.  He seems to be more stable.  His hemoglobin was still low today and he received 2 more units of packed red blood cells.  He has had 2 bowel movements since the procedure with some blood in them that was likely old blood.  The patient understands that he will likely passed several bowel movements with blood clearing out what was already in his system.  He denies any current pain.  He has had some nausea.  He had markedly elevated BUN and he has significant chronic kidney disease which was worse on admission.  He denies fever or chills.  No previous ulcer treatment or surgery to his knowledge.  At this point, the GI service does not sound like they have much more to offer from an endoscopic fashion so they have asked that we be consulted for potential embolization if recurrent bleeding occurs.  Current Facility-Administered Medications  Medication Dose Route Frequency Provider Last Rate Last Dose  . 0.9 %  sodium chloride infusion   Intravenous Once Varughese, Bincy S, NP      . acetaminophen (TYLENOL) tablet 650 mg  650 mg Oral Q6H PRN Mody, Sital, MD       Or  . acetaminophen (TYLENOL) suppository 650 mg  650 mg Rectal Q6H PRN Mody, Sital, MD      . bisacodyl (DULCOLAX) EC tablet 5 mg  5 mg Oral Daily PRN Bettey Costa, MD      . cholecalciferol (VITAMIN D) tablet 2,000 Units  2,000 Units Oral Daily  Bettey Costa, MD   2,000 Units at 12/26/16 0957  . feeding supplement (ENSURE ENLIVE) (ENSURE ENLIVE) liquid 237 mL  237 mL Oral TID BM Bettey Costa, MD   237 mL at 12/01/2016 2019  . HYDROcodone-acetaminophen (NORCO/VICODIN) 5-325 MG per tablet 1-2 tablet  1-2 tablet Oral Q4H PRN Bettey Costa, MD      . multivitamin-lutein (OCUVITE-LUTEIN) capsule 1 capsule  1 capsule Oral BID Bettey Costa, MD   1 capsule at 12/26/16 0957  . ondansetron (ZOFRAN) tablet 4 mg  4 mg Oral Q6H PRN Mody, Sital, MD       Or  . ondansetron (ZOFRAN) injection 4 mg  4 mg Intravenous Q6H PRN Mody, Sital, MD      . pantoprazole (PROTONIX) injection 40 mg  40 mg Intravenous Q12H [name] B, MD   40 mg at 12/26/16 0957  . polyethylene glycol (MIRALAX / GLYCOLAX) packet 17 g  17 g Oral Daily PRN Bettey Costa, MD      . vitamin E capsule 400 Units  400 Units Oral Daily Bettey Costa, MD   400 Units at 12/26/16 0957   Facility-Administered Medications Ordered in Other Encounters  Medication Dose Route Frequency Provider Last Rate Last Dose  . epoetin alfa (EPOGEN,PROCRIT) injection 10,000 Units  10,000 Units Subcutaneous Weekly Janese Banks,  Weston Anna, MD   10,000 Units at 12/21/16 1535    Past Medical History:  Diagnosis Date  . CHF (congestive heart failure) ([place])   . Diabetes mellitus without complication ([place])   . HTN (hypertension)   . Prostate cancer ([place])   . Skin cancer     Past Surgical History:  Procedure Laterality Date  . ESOPHAGOGASTRODUODENOSCOPY (EGD) WITH PROPOFOL N/A 12/04/2016   Procedure: ESOPHAGOGASTRODUODENOSCOPY (EGD) WITH PROPOFOL;  Surgeon: [name], MD;  Location: [facility] ENDOSCOPY;  Service: Endoscopy;  Laterality: N/A;  . PROSTATECTOMY      Social History Social History  Substance Use Topics  . Smoking status: Former Smoker    Types: Cigarettes  . Smokeless tobacco: Never Used  . Alcohol use Yes  Married  Family History Family History  Problem Relation Age of Onset  .  Hypertension Mother   No bleeding disorders, clotting disorders, autoimmune diseases, or aneurysms  Allergies  Allergen Reactions  . Doxycycline Rash  . Lisinopril Rash  . Penicillins Rash    Has patient had a PCN reaction causing immediate rash, facial/tongue/throat swelling, SOB or lightheadedness with hypotension: Yes Has patient had a PCN reaction causing severe rash involving mucus membranes or skin necrosis: No Has patient had a PCN reaction that required hospitalization: No Has patient had a PCN reaction occurring within the last 10 years: No If all of the above answers are "NO", then may proceed with Cephalosporin use.     REVIEW OF SYSTEMS (Negative unless checked)  Constitutional: [] Weight loss  [] Fever  [] Chills Cardiac: [] Chest pain   [] Chest pressure   [] Palpitations   [] Shortness of breath when laying flat   [] Shortness of breath at rest   [x] Shortness of breath with exertion. Vascular:  [] Pain in legs with walking   [] Pain in legs at rest   [] Pain in legs when laying flat   [] Claudication   [] Pain in feet when walking  [] Pain in feet at rest  [] Pain in feet when laying flat   [] History of DVT   [] Phlebitis   [] Swelling in legs   [] Varicose veins   [] Non-healing ulcers Pulmonary:   [] Uses home oxygen   [] Productive cough   [] Hemoptysis   [] Wheeze  [] COPD   [] Asthma Neurologic:  [] Dizziness  [] Blackouts   [] Seizures   [] History of stroke   [] History of TIA  [] Aphasia   [] Temporary blindness   [] Dysphagia   [] Weakness or numbness in arms   [] Weakness or numbness in legs Musculoskeletal:  [x] Arthritis   [] Joint swelling   [] Joint pain   [] Low back pain Hematologic:  [] Easy bruising  [] Easy bleeding   [] Hypercoagulable state   [x] Anemic  [] Hepatitis Gastrointestinal:  [x] Blood in stool   [] Vomiting blood  [x] Gastroesophageal reflux/heartburn   [] Difficulty swallowing. Genitourinary:  [x] Chronic kidney disease   [] Difficult urination  [] Frequent urination  [] Burning with urination    [] Blood in urine Skin:  [] Rashes   [] Ulcers   [] Wounds Psychological:  [] History of anxiety   []  History of major depression.  Physical Examination  Vitals:   12/26/16 1000 12/26/16 1100 12/26/16 1200 12/26/16 1300  BP: 101/65 104/62 106/68 124/65  Pulse: 76 62 61 65  Resp: 19 15 17  (!) 22  Temp:  97.9 F (36.6 C)    TempSrc:  Oral    SpO2: 100% 99% 100% 99%  Weight:      Height:       Body mass index is 18.98 kg/m. Gen:  Frail and elderly, NAD Head:  [place]/AT, + temporalis wasting.  Ear/Nose/Throat: Hearing grossly intact, nares w/o erythema or drainage, oropharynx w/o Erythema/Exudate Eyes: Sclera non-icteric, conjunctiva clear Neck: Trachea midline.  No JVD.  Pulmonary:  Good air movement, respirations not labored, equal bilaterally.  Cardiac: irregular Vascular:  Vessel Right Left  Radial Palpable Palpable                          PT Palpable Palpable  DP Palpable Palpable   Gastrointestinal: soft, non-tender/non-distended. No guarding/reflex.  Musculoskeletal: M/S 5/5 throughout.  Extremities without ischemic changes.  No deformity or atrophy.  Neurologic: Sensation grossly intact in extremities.  Symmetrical.  Speech is fluent. Motor exam as listed above. Psychiatric: Judgment intact, Mood & affect appropriate for pt's clinical situation. Dermatologic: No rashes or ulcers noted.  No cellulitis or open wounds.       CBC Lab Results  Component Value Date   WBC 8.2 12/26/2016   HGB 8.2 (L) 12/26/2016   HCT 24.4 (L) 12/26/2016   MCV 92.9 12/26/2016   PLT 294 12/26/2016    BMET    Component Value Date/Time   NA 139 12/26/2016 0441   K 4.5 12/26/2016 0441   CL 113 (H) 12/26/2016 0441   CO2 21 (L) 12/26/2016 0441   GLUCOSE 128 (H) 12/26/2016 0441   BUN 94 (H) 12/26/2016 0441   CREATININE 2.52 (H) 12/26/2016 0441   CALCIUM 7.5 (L) 12/26/2016 0441   GFRNONAA 21 (L) 12/26/2016 0441   GFRAA 25 (L) 12/26/2016 0441   Estimated Creatinine Clearance: 17.5  mL/min (A) (by C-G formula based on SCr of 2.52 mg/dL (H)).  COAG No results found for: INR, PROTIME  Radiology Ct Renal Stone Study  Result Date: 12/07/2016 CLINICAL DATA:  83 year old male with weakness, generalized pain, anemia, renal failure. EXAM: CT ABDOMEN AND PELVIS WITHOUT CONTRAST TECHNIQUE: Multidetector CT imaging of the abdomen and pelvis was performed following the standard protocol without IV contrast. COMPARISON:  04/18/2015 CT Abdomen and Pelvis FINDINGS: Lower chest: Partially visible cardiac pacemaker or AICD leads. No pericardial effusion. Mild cardiomegaly. Negative lung bases. No pleural effusion. Hepatobiliary: Small layering cholelithiasis. No pericholecystic inflammation. Negative noncontrast liver ; small chronic calcified granulomas in the left lobe. Pancreas: Within normal limits. Spleen: Negative. Adrenals/Urinary Tract: Normal adrenal glands. Bilateral noncontrast kidneys appears stable and normal for age. No hydroureter. Negative course of both ureters. Small right hemipelvis phleboliths are stable. Unremarkable urinary bladder. Stomach/Bowel: Negative rectum. Redundant sigmoid colon with retained stool but otherwise negative. Retained stool in the left colon. Minimal diverticulae, no active inflammation. Continued retained stool throughout the transverse colon which is otherwise negative. Redundant hepatic flexure and right colon which is mostly decompressed. Elongated but normal appendix. Negative terminal ileum. No dilated small bowel. Negative stomach. The second portion of the duodenum is fluid-filled, but not definitely inflamed or abnormal (series 2, image 28). The remaining duodenum is negative. No abdominal free fluid or free air. Vascular/Lymphatic: Extensive Aortoiliac calcified atherosclerosis. Vascular patency is not evaluated in the absence of IV contrast. No lymphadenopathy. Reproductive: Stable small left fat containing inguinal hernia. Other: No pelvic free  fluid. Musculoskeletal: Osteopenia. Diffuse lower thoracic and lumbar spinal compression fractures sparing the L3 level. Prior proximal left femur ORIF. Stable visualized osseous structures. IMPRESSION: 1. No convincing acute or inflammatory process in the noncontrast abdomen or pelvis. 2.  Aortic Atherosclerosis (ICD10-I70.0). 3. Chronic cholelithiasis. 4. Chronic spinal compression fractures. Electronically Signed   By: Herminio Heads.D.  On: 12/07/2016 17:56      Assessment/Plan 1.  Upper GI bleed.  Appears to have stopped or slowed at this point with endoscopic therapy.  Large duodenal ulcer with visible vessel.  This is likely the gastroduodenal  artery.  If he does have recurrent bleeding embolization would certainly be an option.  A major concern with this would be his stage IV chronic kidney disease and markedly elevated BUN as the contrast from the procedure could certainly put him into renal failure.  This was discussed with the family, but if GI does not think they have much more to offer from an endoscopic fashion recurrent bleeding could be managed with embolization.  We will be available for this. 2.  Chronic kidney disease stage IV.  Complicates the situation as described above. 3.  Diabetes.  Stable on outpatient medications and blood glucose control important in reducing the progression of atherosclerotic disease. Also, involved in wound healing. On appropriate medications. 4.  Hypertension.  Stable on outpatient medications and with his recent bleeding more concern is present for hypotension.   Leotis Pain, MD  12/26/2016 3:35 PM    This note was created with Dragon medical transcription system.  Any error is purely unintentional

## 2016-12-26 NOTE — Progress Notes (Signed)
New lab results reveal small drop in Hgb.  Also experiencing soft blood pressures.  Discussed both with Bincy, NP who ordered NS bolus.  Will continue to monitor

## 2016-12-26 NOTE — Consult Note (Signed)
Consultation Note Date: 12/26/2016   Patient Name: Aaron Barnett  DOB: [birth year]  MRN: [id number]  Age / Sex: 83 y.o., male  PCP: Tracie Harrier, MD Referring Physician: Max Sane, MD  Reason for Consultation: Establishing goals of care  HPI/Patient Profile: 83 y.o. male  with past medical history of CHF (EF 50%, not on home O2, no recent exacerbations, AICD), CKD stage III, DM admitted on 12/11/2016 with Hgb of 4.8 and progressive weakness. This is his second admission in October for anemia and AKI- he was discharged Oct. 10/16 to [facility] for rehab but went back to independent living. Workup this admission reveals GI bleed, AKI with Cr up to 2.76. He has had transfusions. GI consulted and EGD revealed bleeding duodenal ulcer which was clipped. Patient admitted to SDU for observation. Palliative medicine consulted for [place].    Clinical Assessment and Goals of Care:  I have reviewed medical records including EPIC notes, labs and imaging, received report from patient's RN, assessed the patient and then met at the bedside along with patient's wife  to discuss diagnosis prognosis, GOC, EOL wishes, disposition and options. Patient's wife requested we first meet separately as she did not want to upset patient. She states she has HCPOA and they have discussed his wishes.   I introduced Palliative Medicine as specialized medical care for people living with serious illness. It focuses on providing relief from the symptoms and stress of a serious illness. The goal is to improve quality of life for both the patient and the family.  We discussed a brief life review of the patient. He was a professor of Production designer, theatre/television/film at [employer]. He enjoys his telescopes, technology, reading, playing golf.   As far as functional and nutritional status- prior to his first admission in October he was driving, caring for his own  ADL's, cooking his own meals, going to church. There has been no decline in his cognitive status. His spouse notes no weight loss.    We discussed their current illness and what it means in the larger context of their on-going co-morbidities.  Natural disease trajectory and expectations at EOL were discussed. His spouse notes that she worries he will decline, but feels "he has a lot of life to live" if "we can get him over this".   I attempted to elicit values and goals of care important to the patient.  As long as patient is able to communicate, fiddle with his telescopes, read, then he has good quality of life.   The difference between aggressive medical intervention and comfort care was considered in light of the patient's goals of care.   Advanced directives, concepts specific to code status, artifical feeding and hydration, and rehospitalization were considered and discussed. Patient has a living will and would not want aggressive measures in the event of an irreversible terminal process. He also would not want dialysis per his spouse. Spouse desires full code status. We discussed that DNR may be more in line with patient's GOC as full resuscitative efforts  would likely not leave him in desired functional state. Encouraged patient's spouse to discuss this with her husband.   Hospice and Palliative Care services outpatient were explained and offered. Patient is not yet eligible for hospice services. Outpatient Palliative would be appropriate, but are declined.   Questions and concerns were addressed.   Primary Decision Maker PATIENT - and patient's spouse    SUMMARY OF RECOMMENDATIONS -Full scope care -D/C to rehab -PMT will continue to follow peripherally and intervene if needed- please call if reconsult needed earlier    Code Status/Advance Care Planning:  Full code  Prognosis:    Unable to determine  Discharge Planning: To Be Determined  Primary Diagnoses: Present on  Admission: . Anemia   I have reviewed the medical record, interviewed the patient and family, and examined the patient. The following aspects are pertinent.  Past Medical History:  Diagnosis Date  . CHF (congestive heart failure) ([place])   . Diabetes mellitus without complication ([place])   . HTN (hypertension)   . Prostate cancer ([place])   . Skin cancer    Social History   Social History  . Marital status: Married    Spouse name: N/A  . Number of children: N/A  . Years of education: N/A   Social History Main Topics  . Smoking status: Former Smoker    Types: Cigarettes  . Smokeless tobacco: Never Used  . Alcohol use Yes  . Drug use: No  . Sexual activity: Not Asked   Other Topics Concern  . None   Social History Narrative  . None   Family History  Problem Relation Age of Onset  . Hypertension Mother    Scheduled Meds: . cholecalciferol  2,000 Units Oral Daily  . feeding supplement (ENSURE ENLIVE)  237 mL Oral TID BM  . multivitamin-lutein  1 capsule Oral BID  . pantoprazole  40 mg Intravenous Q12H  . vitamin E  400 Units Oral Daily   Continuous Infusions: . sodium chloride     PRN Meds:.acetaminophen **OR** acetaminophen, bisacodyl, HYDROcodone-acetaminophen, ondansetron **OR** ondansetron (ZOFRAN) IV, polyethylene glycol Medications Prior to Admission:  Prior to Admission medications   Medication Sig Start Date End Date Taking? Authorizing Provider  amLODipine (NORVASC) 2.5 MG tablet Take 2.5 mg by mouth daily.   Yes [provider]  aspirin 325 MG tablet Take 325 mg by mouth daily.   Yes [provider]  carvedilol (COREG) 6.25 MG tablet Take 6.25 mg by mouth 2 (two) times daily with a meal.   Yes [provider]  Cholecalciferol (VITAMIN D3) 2000 units TABS Take 1 tablet by mouth daily.   Yes [provider]  feeding supplement, ENSURE ENLIVE, (ENSURE ENLIVE) LIQD Take 237 mLs by mouth 3 (three) times daily between meals.  12/11/16  Yes Fritzi Mandes, MD  fenofibrate (TRICOR) 145 MG tablet Take 145 mg by mouth daily.   Yes [provider]  furosemide (LASIX) 20 MG tablet Take 20 mg by mouth daily.   Yes [provider]  Multiple Vitamin (MULTIVITAMIN) capsule Take 1 capsule by mouth daily.   Yes [provider]  Multiple Vitamins-Minerals (PRESERVISION AREDS 2+MULTI VIT PO) Take 1 capsule by mouth 2 (two) times daily.   Yes [provider]  vitamin E 400 UNIT capsule Take 1 capsule by mouth daily.   Yes [provider]  nitroGLYCERIN (NITROSTAT) 0.4 MG SL tablet Place 1 tablet under the tongue every 5 (five) minutes as needed. 06/12/16   [provider]   Allergies  Allergen Reactions  . Doxycycline Rash  . Lisinopril Rash  . Penicillins Rash    Has patient had a PCN reaction causing immediate rash, facial/tongue/throat swelling, SOB or lightheadedness with hypotension: Yes Has patient had a PCN reaction causing severe rash involving mucus membranes or skin necrosis: No Has patient had a PCN reaction that required hospitalization: No Has patient had a PCN reaction occurring within the last 10 years: No If all of the above answers are "NO", then may proceed with Cephalosporin use.   Review of Systems  Physical Exam  Constitutional: He is oriented to person, place, and time. No distress.  Frail, elderly  Cardiovascular: Normal rate and regular rhythm.   Pulmonary/Chest: Effort normal and breath sounds normal.  Neurological: He is alert and oriented to person, place, and time.  Skin: Skin is warm and dry.  Nursing note and vitals reviewed.   Vital Signs: BP 104/62   Pulse 62   Temp 97.9 F (36.6 C) (Oral)   Resp 15   Ht 5' 10"  (1.778 m)   Wt 60 kg (132 lb 4.4 oz)   SpO2 99%   BMI 18.98 kg/m  Pain Assessment: No/denies pain   Pain Score: 0-No pain   SpO2: SpO2: 99 % O2 Device:SpO2: 99 % O2 Flow Rate: .O2 Flow Rate (L/min): 2 L/min  IO:  Intake/output summary:  Intake/Output Summary (Last 24 hours) at 12/26/16 1248 Last data filed at 12/26/16 1100  Gross per 24 hour  Intake             1060 ml  Output              375 ml  Net              685 ml    LBM: Last BM Date: 12/23/16 Baseline Weight: Weight: 63.5 kg (140 lb) Most recent weight: Weight: 60 kg (132 lb 4.4 oz)     Palliative Assessment/Data: PPS: 30%     Thank you for this consult. Palliative medicine will continue to follow and assist as needed.   Time In: 1145 Time Out: 1300 Time Total: 75 mins Greater than 50%  of this time was spent counseling and coordinating care related to the above assessment and plan.  Signed by: Mariana Kaufman, AGNP-C Palliative Medicine    Please contact Palliative Medicine Team phone at [phone] for questions and concerns.  For individual provider: See Shea Evans

## 2016-12-26 NOTE — Progress Notes (Signed)
2 BM's during shift both with blood in stool.  AM Hgb of 6.1.  Bincy, NP informed.  New orders expected directly.  Will continue to monitor.

## 2016-12-27 DIAGNOSIS — K922 Gastrointestinal hemorrhage, unspecified: Secondary | ICD-10-CM

## 2016-12-27 LAB — CBC
HCT: 21.1 % — ABNORMAL LOW (ref 40.0–52.0)
Hemoglobin: 7.4 g/dL — ABNORMAL LOW (ref 13.0–18.0)
MCH: 31.7 pg (ref 26.0–34.0)
MCHC: 34.9 g/dL (ref 32.0–36.0)
MCV: 91 fL (ref 80.0–100.0)
PLATELETS: 309 10*3/uL (ref 150–440)
RBC: 2.32 MIL/uL — ABNORMAL LOW (ref 4.40–5.90)
RDW: 17.2 % — AB (ref 11.5–14.5)
WBC: 8.7 10*3/uL (ref 3.8–10.6)

## 2016-12-27 LAB — BPAM RBC
BLOOD PRODUCT EXPIRATION DATE: 201811292359
Blood Product Expiration Date: 201811152359
Blood Product Expiration Date: 201811292359
Blood Product Expiration Date: 201811292359
ISSUE DATE / TIME: 201810291847
ISSUE DATE / TIME: 201810292356
ISSUE DATE / TIME: 201810291503
ISSUE DATE / TIME: 201810310805
UNIT TYPE AND RH: 5100
UNIT TYPE AND RH: 5100
Unit Type and Rh: 5100
Unit Type and Rh: 5100

## 2016-12-27 LAB — CBC WITH DIFFERENTIAL/PLATELET
BASOS ABS: 0.1 10*3/uL (ref 0–0.1)
Basophils Relative: 1 %
Eosinophils Absolute: 0.5 10*3/uL (ref 0–0.7)
Eosinophils Relative: 7 %
HCT: 21.9 % — ABNORMAL LOW (ref 40.0–52.0)
Hemoglobin: 7.4 g/dL — ABNORMAL LOW (ref 13.0–18.0)
LYMPHS ABS: 0.9 10*3/uL — AB (ref 1.0–3.6)
Lymphocytes Relative: 12 %
MCH: 31.1 pg (ref 26.0–34.0)
MCHC: 33.8 g/dL (ref 32.0–36.0)
MCV: 92 fL (ref 80.0–100.0)
MONO ABS: 0.8 10*3/uL (ref 0.2–1.0)
Monocytes Relative: 10 %
NEUTROS ABS: 5.5 10*3/uL (ref 1.4–6.5)
Neutrophils Relative %: 70 %
PLATELETS: 323 10*3/uL (ref 150–440)
RBC: 2.38 MIL/uL — AB (ref 4.40–5.90)
RDW: 17.7 % — AB (ref 11.5–14.5)
WBC: 7.8 10*3/uL (ref 3.8–10.6)

## 2016-12-27 LAB — TYPE AND SCREEN
ABO/RH(D): O POS
ANTIBODY SCREEN: NEGATIVE
UNIT DIVISION: 0
UNIT DIVISION: 0
Unit division: 0
Unit division: 0

## 2016-12-27 LAB — BASIC METABOLIC PANEL
Anion gap: 4 — ABNORMAL LOW (ref 5–15)
BUN: 86 mg/dL — AB (ref 6–20)
CO2: 22 mmol/L (ref 22–32)
CREATININE: 2.51 mg/dL — AB (ref 0.61–1.24)
Calcium: 7.5 mg/dL — ABNORMAL LOW (ref 8.9–10.3)
Chloride: 114 mmol/L — ABNORMAL HIGH (ref 101–111)
GFR calc Af Amer: 25 mL/min — ABNORMAL LOW (ref >60)
GFR, EST NON AFRICAN AMERICAN: 22 mL/min — AB (ref >60)
Glucose, Bld: 104 mg/dL — ABNORMAL HIGH (ref 65–99)
Potassium: 4.5 mmol/L (ref 3.5–5.1)
SODIUM: 140 mmol/L (ref 135–145)

## 2016-12-27 NOTE — Progress Notes (Signed)
[facility]  ROUNDING NOTE   Subjective:  Renal function about the same today. Next line creatinine stable at 2.5. BUN slightly down to 86. Hemoglobin currently 7.4.   Objective:  Vital signs in last 24 hours:  Temp:  [97.9 F (36.6 C)-98.3 F (36.8 C)] 98 F (36.7 C) (11/01 0754) Pulse Rate:  [58-78] 67 (11/01 0912) Resp:  [14-25] 22 (11/01 0912) BP: (68-124)/(42-76) 108/70 (11/01 0912) SpO2:  [91 %-100 %] 98 % (11/01 0912)  Weight change:  Filed Weights   12/06/2016 1255 12/26/2016 0500 12/21/2016 1722  Weight: 63.5 kg (140 lb) 60.9 kg (134 lb 4.8 oz) 60 kg (132 lb 4.4 oz)    Intake/Output: I/O last 3 completed shifts: In: 310 [Blood:310] Out: 490 [Urine:490]   Intake/Output this shift:  Total I/O In: -  Out: 410 [Urine:410]  Physical Exam: General: NAD, laying in bed  Head: Normocephalic, atraumatic. Moist oral mucosal membranes  Eyes: Anicteric  Neck: Supple, trachea midline  Lungs:  Clear to auscultation, normal effort  Heart: S1S2 no rubs  Abdomen:  Soft, nontender, BS present  Extremities: no peripheral edema.  Neurologic: Nonfocal, moving all four extremities  Skin: No lesions       Basic Metabolic Panel:  Recent Labs Lab 12/22/2016 1302 11/28/2016 0343 12/26/16 0441 12/27/16 0543  NA 135 137 139 140  K 4.6 4.0 4.5 4.5  CL 104 110 113* 114*  CO2 21* 20* 21* 22  GLUCOSE 161* 87 128* 104*  BUN 90* 90* 94* 86*  CREATININE 3.00* 2.76* 2.52* 2.51*  CALCIUM 8.0* 7.4* 7.5* 7.5*  MG  --   --  2.0  --     Liver Function Tests:  Recent Labs Lab 11/28/2016 1302  AST 26  ALT 18  ALKPHOS 26*  BILITOT 0.7  PROT 5.8*  ALBUMIN 2.8*   No results for input(s): LIPASE, AMYLASE in the last 168 hours. No results for input(s): AMMONIA in the last 168 hours.  CBC:  Recent Labs Lab 12/03/2016 1302 12/21/2016 0343 12/26/16 0441 12/26/16 1333 12/26/16 1924 12/27/16 0543  WBC 9.1 8.0 8.2  --   --  8.7  NEUTROABS 7.1*  --   --   --   --   --    HGB 5.0* 8.2* 6.1* 8.2* 7.9* 7.4*  HCT 15.5* 24.2* 17.6* 24.4* 23.0* 21.1*  MCV 93.1 91.5 92.9  --   --  91.0  PLT 521* 335 294  --   --  309    Cardiac Enzymes:  Recent Labs Lab 12/23/2016 1302  TROPONINI <0.03    BNP: Invalid input(s): POCBNP  CBG:  Recent Labs Lab 12/01/2016 1650 11/27/2016 1148 12/25/16 1716  GLUCAP 116* 101* 115*    Microbiology: No results found for this or any previous visit.  Coagulation Studies: No results for input(s): LABPROT, INR in the last 72 hours.  Urinalysis: No results for input(s): COLORURINE, LABSPEC, PHURINE, GLUCOSEU, HGBUR, BILIRUBINUR, KETONESUR, PROTEINUR, UROBILINOGEN, NITRITE, LEUKOCYTESUR in the last 72 hours.  Invalid input(s): APPERANCEUR    Imaging: No results found.   Medications:   . sodium chloride     . cholecalciferol  2,000 Units Oral Daily  . feeding supplement (ENSURE ENLIVE)  237 mL Oral TID BM  . multivitamin-lutein  1 capsule Oral BID  . pantoprazole  40 mg Intravenous Q12H  . vitamin E  400 Units Oral Daily   acetaminophen **OR** acetaminophen, bisacodyl, HYDROcodone-acetaminophen, ondansetron **OR** ondansetron (ZOFRAN) IV, polyethylene glycol  Assessment/ Plan:  Mr.  Aaron Barnett is a 83 y.o. white male withcongestive heart failure and history of cardiomyopathy with biventricular ICD in place, diabetes mellitus type 2, hypertension, prostate cancer  1. Acute renal failure on chronic kidney disease stage IV:  Recent baseline Cr was 2.2.   - Creatinine is stabilized at 2.5. BUN also coming down and currently 86. Continue to monitor renal function daily. No urgent indication for dialysis at the moment.  2. Anemia of CKD with acute GI bleed hgb 4.8 at admission.   - Patient received blood transfusion yesterday. Hemoglobin currently 7.4. Continue to monitor CBC closely.  3. Diabetes mellitus type II with chronic kidney disease: management as per hospitalist.  4. Hypertension: Blood pressure recently  has been lower.  He has been taken off of amlodipine and carvedilol.   LOS: 3 Aaron Barnett 11/1/201810:12 AM

## 2016-12-27 NOTE — Consult Note (Signed)
[facility] Consultation Note  Patient ID: Aaron Barnett, MRN: [id number], DOB/AGE: [birth year] 81 y.o. Admit date: 12/20/2016   Date of Consult: 12/27/2016 Primary Physician: Tracie Harrier, MD Primary Cardiologist: Ubaldo Glassing  Chief Complaint:  Chief Complaint  Patient presents with  . Abnormal Lab   Reason for Consult: artery atherosclerosis  HPI: 83 y.o. male with known diabetes with complication essential hypertension mixed hyperlipidemia and chronic systolic dysfunction congestive heart failure with ejection fraction of 50% by echocardiogram earlier in the year and a stress test last year showing no evidence of myocardial ischemia who is had a significant GI bleed and ulcer with the thrombus. The patient has been on appropriate medication management in the past for cardiovascular disease and stable with no evidence of heart failure during this hospitalization and no evidence of myocardial infarction with significant stressors of tachycardia and GI bleed. The patient has therefore at lower risk for continued cardiac complication at this time with discontinuation of aspirin temporarily to reduce the possibility of further GI bleed while patient has healing of ulcer. Lately there is no additional chest pain shortness of breath the congestive heart failure or cardiovascular symptoms  Past Medical History:  Diagnosis Date  . CHF (congestive heart failure) ([place])   . Diabetes mellitus without complication ([place])   . HTN (hypertension)   . Prostate cancer ([place])   . Skin cancer       Surgical History:  Past Surgical History:  Procedure Laterality Date  . ESOPHAGOGASTRODUODENOSCOPY (EGD) WITH PROPOFOL N/A 12/07/2016   Procedure: ESOPHAGOGASTRODUODENOSCOPY (EGD) WITH PROPOFOL;  Surgeon: [name], MD;  Location: [facility] ENDOSCOPY;  Service: Endoscopy;  Laterality: N/A;  . PROSTATECTOMY       Home Meds: Prior to Admission medications   Medication Sig Start Date End Date  Taking? Authorizing Provider  amLODipine (NORVASC) 2.5 MG tablet Take 2.5 mg by mouth daily.   Yes [provider]  aspirin 325 MG tablet Take 325 mg by mouth daily.   Yes [provider]  carvedilol (COREG) 6.25 MG tablet Take 6.25 mg by mouth 2 (two) times daily with a meal.   Yes [provider]  Cholecalciferol (VITAMIN D3) 2000 units TABS Take 1 tablet by mouth daily.   Yes [provider]  feeding supplement, ENSURE ENLIVE, (ENSURE ENLIVE) LIQD Take 237 mLs by mouth 3 (three) times daily between meals. 12/11/16  Yes Fritzi Mandes, MD  fenofibrate (TRICOR) 145 MG tablet Take 145 mg by mouth daily.   Yes [provider]  furosemide (LASIX) 20 MG tablet Take 20 mg by mouth daily.   Yes [provider]  Multiple Vitamin (MULTIVITAMIN) capsule Take 1 capsule by mouth daily.   Yes [provider]  Multiple Vitamins-Minerals (PRESERVISION AREDS 2+MULTI VIT PO) Take 1 capsule by mouth 2 (two) times daily.   Yes [provider]  vitamin E 400 UNIT capsule Take 1 capsule by mouth daily.   Yes [provider]  nitroGLYCERIN (NITROSTAT) 0.4 MG SL tablet Place 1 tablet under the tongue every 5 (five) minutes as needed. 06/12/16   [provider]    Inpatient Medications:  . cholecalciferol  2,000 Units Oral Daily  . feeding supplement (ENSURE ENLIVE)  237 mL Oral TID BM  . multivitamin-lutein  1 capsule Oral BID  . pantoprazole  40 mg Intravenous Q12H  . vitamin E  400 Units Oral Daily   . sodium chloride      Allergies:  Allergies  Allergen Reactions  .  Doxycycline Rash  . Lisinopril Rash  . Penicillins Rash    Has patient had a PCN reaction causing immediate rash, facial/tongue/throat swelling, SOB or lightheadedness with hypotension: Yes Has patient had a PCN reaction causing severe rash involving mucus membranes or skin necrosis: No Has patient had a PCN reaction that required hospitalization: No Has  patient had a PCN reaction occurring within the last 10 years: No If all of the above answers are "NO", then may proceed with Cephalosporin use.    Social History   Social History  . Marital status: Married    Spouse name: N/A  . Number of children: N/A  . Years of education: N/A   Occupational History  . Not on file.   Social History Main Topics  . Smoking status: Former Smoker    Types: Cigarettes  . Smokeless tobacco: Never Used  . Alcohol use Yes  . Drug use: No  . Sexual activity: Not on file   Other Topics Concern  . Not on file   Social History Narrative  . No narrative on file     Family History  Problem Relation Age of Onset  . Hypertension Mother      Review of Systems Positive for bleeding melena Negative for: General:  chills, fever, night sweats or weight changes.  Cardiovascular: PND orthopnea syncope dizziness  Dermatological skin lesions rashes Respiratory: Cough congestion Urologic: Frequent urination urination at night and hematuria Abdominal: negative for nausea, vomiting, diarrhea, bright red blood per rectum, positive for positive for melena, or hematemesis Neurologic: negative for visual changes, and/or hearing changes  All other systems reviewed and are otherwise negative except as noted above.  Labs:  Recent Labs  12/23/2016 1302  TROPONINI <0.03   Lab Results  Component Value Date   WBC 8.7 12/27/2016   HGB 7.4 (L) 12/27/2016   HCT 21.1 (L) 12/27/2016   MCV 91.0 12/27/2016   PLT 309 12/27/2016    Recent Labs Lab 11/29/2016 1302  12/27/16 0543  NA 135  < > 140  K 4.6  < > 4.5  CL 104  < > 114*  CO2 21*  < > 22  BUN 90*  < > 86*  CREATININE 3.00*  < > 2.51*  CALCIUM 8.0*  < > 7.5*  PROT 5.8*  --   --   BILITOT 0.7  --   --   ALKPHOS 26*  --   --   ALT 18  --   --   AST 26  --   --   GLUCOSE 161*  < > 104*  < > = values in this interval not displayed. No results found for: CHOL, HDL, LDLCALC, TRIG No results found for:  DDIMER  Radiology/Studies:  Ct Renal Stone Study  Result Date: 12/07/2016 CLINICAL DATA:  83 year old male with weakness, generalized pain, anemia, renal failure. EXAM: CT ABDOMEN AND PELVIS WITHOUT CONTRAST TECHNIQUE: Multidetector CT imaging of the abdomen and pelvis was performed following the standard protocol without IV contrast. COMPARISON:  04/18/2015 CT Abdomen and Pelvis FINDINGS: Lower chest: Partially visible cardiac pacemaker or AICD leads. No pericardial effusion. Mild cardiomegaly. Negative lung bases. No pleural effusion. Hepatobiliary: Small layering cholelithiasis. No pericholecystic inflammation. Negative noncontrast liver ; small chronic calcified granulomas in the left lobe. Pancreas: Within normal limits. Spleen: Negative. Adrenals/Urinary Tract: Normal adrenal glands. Bilateral noncontrast kidneys appears stable and normal for age. No hydroureter. Negative course of both ureters. Small right hemipelvis phleboliths are stable. Unremarkable urinary bladder. Stomach/Bowel:  Negative rectum. Redundant sigmoid colon with retained stool but otherwise negative. Retained stool in the left colon. Minimal diverticulae, no active inflammation. Continued retained stool throughout the transverse colon which is otherwise negative. Redundant hepatic flexure and right colon which is mostly decompressed. Elongated but normal appendix. Negative terminal ileum. No dilated small bowel. Negative stomach. The second portion of the duodenum is fluid-filled, but not definitely inflamed or abnormal (series 2, image 28). The remaining duodenum is negative. No abdominal free fluid or free air. Vascular/Lymphatic: Extensive Aortoiliac calcified atherosclerosis. Vascular patency is not evaluated in the absence of IV contrast. No lymphadenopathy. Reproductive: Stable small left fat containing inguinal hernia. Other: No pelvic free fluid. Musculoskeletal: Osteopenia. Diffuse lower thoracic and lumbar spinal compression  fractures sparing the L3 level. Prior proximal left femur ORIF. Stable visualized osseous structures. IMPRESSION: 1. No convincing acute or inflammatory process in the noncontrast abdomen or pelvis. 2.  Aortic Atherosclerosis (ICD10-I70.0). 3. Chronic cholelithiasis. 4. Chronic spinal compression fractures. Electronically Signed   By: Genevie Ann M.D.   On: 12/07/2016 17:56    EKG: Atrial sensed with ventricular paced  Weights: Filed Weights   12/20/2016 1255 12/09/2016 0500 12/12/2016 1722  Weight: 63.5 kg (140 lb) 60.9 kg (134 lb 4.8 oz) 60 kg (132 lb 4.4 oz)     Physical Exam: Blood pressure (!) 100/59, pulse 76, temperature 98 F (36.7 C), temperature source Oral, resp. rate (!) 23, height 5\' 10"  (1.778 m), weight 60 kg (132 lb 4.4 oz), SpO2 91 %. Body mass index is 18.98 kg/m. General: Well developed, well nourished, in no acute distress. Head eyes ears nose throat: Normocephalic, atraumatic, sclera non-icteric, no xanthomas, nares are without discharge. No apparent thyromegaly and/or mass  Lungs: Normal respiratory effort.  no wheezes, no rales, no rhonchi.  Heart: RRR with normal S1 S2. no murmur gallop, no rub, PMI is normal size and placement, carotid upstroke normal without bruit, jugular venous pressure is normal Abdomen: Soft, non-tender, non-distended with normoactive bowel sounds. No hepatomegaly. No rebound/guarding. No obvious abdominal masses. Abdominal aorta is normal size without bruit Extremities: No edema. no cyanosis, no clubbing, no ulcers  Peripheral : 2+ bilateral upper extremity pulses, 2+ bilateral femoral pulses, 2+ bilateral dorsal pedal pulse Neuro: Alert and oriented. No facial asymmetry. No focal deficit. Moves all extremities spontaneously. Musculoskeletal: Normal muscle tone without kyphosis Psych:  Responds to questions appropriately with a normal affect.    Assessment: 83 year old male with the chronic systolic dysfunction heart failure or heart block status  post pacemaker placement chronic kidney disease and coronary artery disease without current evidence of congestive heart failure or myocardial infarction with significant upper GI bleed and ulcer needing discontinuation of aspirin due to concerns of worsening bleeding  Recommendation. 1. Continue supportive care of GI bleed and anemia with transfusions and H2 blockers  2. Okay for discharge continuation of aspirin to reduce recurrent bleeding for the next 3-4 weeks while healing ulcer 3. Continue the treatment for tach systolic dysfunction heart failure with previous medications 4. No further cardiac diagnostics necessary at this time  Signed, Corey Skains M.D. [facility] 12/27/2016, 9:12 AM

## 2016-12-27 NOTE — Progress Notes (Signed)
[facility]  Daily Progress Note   Subjective  - 2 Days Post-Op  No further bloody BM.  Hgb basically stable.  Tolerating liquids  Objective Vitals:   12/27/16 0830 12/27/16 0912 12/27/16 1130 12/27/16 1200  BP: 110/70 108/70 90/60 (!) 96/55  Pulse: 60 67 75 64  Resp: 14 (!) 22 20 14   Temp:    98.1 F (36.7 C)  TempSrc:    Oral  SpO2: 99% 98% 100% 100%  Weight:      Height:        Intake/Output Summary (Last 24 hours) at 12/27/16 1439 Last data filed at 12/27/16 1023  Gross per 24 hour  Intake                0 ml  Output             1050 ml  Net            -1050 ml    PULM  CTAB CV  Irregular   Laboratory CBC    Component Value Date/Time   WBC 7.8 12/27/2016 1203   HGB 7.4 (L) 12/27/2016 1203   HCT 21.9 (L) 12/27/2016 1203   PLT 323 12/27/2016 1203    BMET    Component Value Date/Time   NA 140 12/27/2016 0543   K 4.5 12/27/2016 0543   CL 114 (H) 12/27/2016 0543   CO2 22 12/27/2016 0543   GLUCOSE 104 (H) 12/27/2016 0543   BUN 86 (H) 12/27/2016 0543   CREATININE 2.51 (H) 12/27/2016 0543   CALCIUM 7.5 (L) 12/27/2016 0543   GFRNONAA 22 (L) 12/27/2016 0543   GFRAA 25 (L) 12/27/2016 0543    Assessment/Planning:    Upper GI bleed.  Appears to have stopped.  No need for embolization today.  No new recs from vascular POV    Leotis Pain  12/27/2016, 2:39 PM

## 2016-12-27 NOTE — Progress Notes (Signed)
Patient ID: Aaron Barnett, male   DOB: 03/21/1927, 83 y.o.   MRN: [id number]  [facility] PROGRESS NOTE  Aaron Barnett FUX:[id number] DOB: December 14, 1927 DOA: 12/03/2016 PCP: Tracie Harrier, MD  HPI/Subjective: Patient feeling okay.  Tolerating liquid diet.  Has not had a bowel movement.  No abdominal pain.  Objective: Vitals:   12/27/16 1130 12/27/16 1200  BP: 90/60 (!) 96/55  Pulse: 75 64  Resp: 20 14  Temp:  98.1 F (36.7 C)  SpO2: 100% 100%    Filed Weights   12/06/2016 1255 11/27/2016 0500 12/01/2016 1722  Weight: 63.5 kg (140 lb) 60.9 kg (134 lb 4.8 oz) 60 kg (132 lb 4.4 oz)    ROS: Review of Systems  Constitutional: Negative for chills and fever.  Eyes: Negative for blurred vision.  Respiratory: Negative for cough and shortness of breath.   Cardiovascular: Negative for chest pain.  Gastrointestinal: Negative for abdominal pain, constipation, diarrhea, nausea and vomiting.  Genitourinary: Negative for dysuria.  Musculoskeletal: Negative for joint pain.  Neurological: Negative for dizziness and headaches.   Exam: Physical Exam  Constitutional: He is oriented to person, place, and time.  HENT:  Nose: No mucosal edema.  Mouth/Throat: No oropharyngeal exudate or posterior oropharyngeal edema.  Eyes: Pupils are equal, round, and reactive to light. Conjunctivae, EOM and lids are normal.  Conjunctiva pale  Neck: No JVD present. Carotid bruit is not present. No edema present. No thyroid mass and no thyromegaly present.  Cardiovascular: S1 normal and S2 normal.  Exam reveals no gallop.   No murmur heard. Pulses:      Dorsalis pedis pulses are 2+ on the right side, and 2+ on the left side.  Respiratory: No respiratory distress. He has no wheezes. He has no rhonchi. He has no rales.  GI: Soft. Bowel sounds are normal. There is no tenderness.  Musculoskeletal:       Right ankle: He exhibits no swelling.       Left ankle: He exhibits no swelling.  Lymphadenopathy:    He has no  cervical adenopathy.  Neurological: He is alert and oriented to person, place, and time. No cranial nerve deficit.  Skin: Skin is warm. No rash noted. Nails show no clubbing.  Psychiatric: He has a normal mood and affect.      Data Reviewed: Basic Metabolic Panel:  Recent Labs Lab 12/06/2016 1302 12/21/2016 0343 12/26/16 0441 12/27/16 0543  NA 135 137 139 140  K 4.6 4.0 4.5 4.5  CL 104 110 113* 114*  CO2 21* 20* 21* 22  GLUCOSE 161* 87 128* 104*  BUN 90* 90* 94* 86*  CREATININE 3.00* 2.76* 2.52* 2.51*  CALCIUM 8.0* 7.4* 7.5* 7.5*  MG  --   --  2.0  --    Liver Function Tests:  Recent Labs Lab 12/26/2016 1302  AST 26  ALT 18  ALKPHOS 26*  BILITOT 0.7  PROT 5.8*  ALBUMIN 2.8*    CBC:  Recent Labs Lab 12/01/2016 1302 12/16/2016 0343 12/26/16 0441 12/26/16 1333 12/26/16 1924 12/27/16 0543 12/27/16 1203  WBC 9.1 8.0 8.2  --   --  8.7 7.8  NEUTROABS 7.1*  --   --   --   --   --  5.5  HGB 5.0* 8.2* 6.1* 8.2* 7.9* 7.4* 7.4*  HCT 15.5* 24.2* 17.6* 24.4* 23.0* 21.1* 21.9*  MCV 93.1 91.5 92.9  --   --  91.0 92.0  PLT 521* 335 294  --   --  309 323  Cardiac Enzymes:  Recent Labs Lab 12/04/2016 1302  TROPONINI <0.03    CBG:  Recent Labs Lab 12/21/2016 1650 12/19/2016 1148 11/26/2016 1716  GLUCAP 116* 101* 115*    Scheduled Meds: . cholecalciferol  2,000 Units Oral Daily  . feeding supplement (ENSURE ENLIVE)  237 mL Oral TID BM  . multivitamin-lutein  1 capsule Oral BID  . pantoprazole  40 mg Intravenous Q12H  . vitamin E  400 Units Oral Daily   Continuous Infusions: . sodium chloride      Assessment/Plan:  1. Acute hemorrhagic blood loss anemia with GI bleed.  Duodenal ulcer with clot seen.  Patient given 3 units of packed red blood cells.  Hemoglobin still on the lower side but holding stable.  Patient will likely end up needing another unit of blood at some point.  Hold aspirin and NSAIDs.  On Protonix 40 mg twice daily.  Suggest IV iron critical care  specialist 2. Acute on chronic kidney disease stage IV. 3. Type 2 diabetes mellitus.  All sugars have been good 4. relative hypotension.  Hold all medications that can lower blood pressure.   5. No current signs of congestive heart failure.   Code Status:     Code Status Orders        Start     Ordered   12/07/2016 1726  Full code  Continuous     12/25/2016 1725    Code Status History    Date Active Date Inactive Code Status Order ID Comments User Context   12/07/2016  8:30 PM 12/11/2016 10:31 PM Full Code [id number]  Dustin Flock, MD Inpatient    Advance Directive Documentation     Most Recent Value  Type of Advance Directive  Healthcare Power of Attorney, Living will  Pre-existing out of facility DNR order (yellow form or pink MOST form)  -  "MOST" Form in Place?  -     Family Communication: As per critical care specialist Disposition Plan: To be determined suggest IV iron  Consultants:  Critical care specialist  Cardiology  Nephrology  Gastroenterology  Procedures:  Endoscopy  Time spent: 25 minutes  [place], [facility]

## 2016-12-27 NOTE — Progress Notes (Signed)
   Name: Aaron Barnett MRN: [id number] DOB: November 19, 1927    BRIEF PATIENT DESCRIPTION:  83 year old male with acute blood loss anemia s/p upper endoscopy, placed in SDU for observation.  STUDIES:  Upper Endoscopy 10/30>>Normal esophagus. Normal stomach. One non-obstructing with adhererent clot duodenal  ulcer with adherent clot. There is no evidence of perforation. Injected, bipolar coagulation used, Clips were placed with successful hemostasis. No specimens collected.  SUBJECTIVE:  No stools overnight or s/sx of bleeding   VITAL SIGNS: Temp:  [97.8 F (36.6 C)-98.3 F (36.8 C)] 98.3 F (36.8 C) (11/01 0100) Pulse Rate:  [58-78] 67 (11/01 0300) Resp:  [15-22] 17 (11/01 0300) BP: (68-132)/(42-76) 85/58 (11/01 0300) SpO2:  [96 %-100 %] 98 % (11/01 0300)  PHYSICAL EXAMINATION: General: Elderly,caucasian male, in no acute distress Neuro:  Awake,alert and oriented HEENT: AT, [place], No JVD Cardiovascular: s1s2,regular, no M/R/G Lungs: clear throughout, even, non labored  Abdomen: +BS x4, soft, non tender non distended  Musculoskeletal: no edema, cyanosis Skin: warm, dry and intact   Recent Labs Lab 12/08/2016 0343 12/26/16 0441 12/27/16 0543  NA 137 139 140  K 4.0 4.5 4.5  CL 110 113* 114*  CO2 20* 21* 22  BUN 90* 94* 86*  CREATININE 2.76* 2.52* 2.51*  GLUCOSE 87 128* 104*    Recent Labs Lab 12/05/2016 0343 12/26/16 0441 12/26/16 1333 12/26/16 1924 12/27/16 0543  HGB 8.2* 6.1* 8.2* 7.9* 7.4*  HCT 24.2* 17.6* 24.4* 23.0* 21.1*  WBC 8.0 8.2  --   --  8.7  PLT 335 294  --   --  309   No results found.  ASSESSMENT / PLAN: Acute on chronic anemia with acute GI bleed  Hypotension secondary to hypovolemia  Acute on chronic renal failure stage IV  Hx: CHF, HTN, Biventricular ICD, and Type II Diabetes Mellitus PLAN Prn Supplemental O2 to maintain O2 sats >92% Serial CBC's and transfuse for hgb <7 or for signs of active bleeding  Avoid NSAIDS and nephrotoxic medications    Continue PPI  Continue clear liquid diet per GI recommendations  Vascular, GI, Cardiology, Nephrology, and Palliative Care consulted appreciate input Maintain map >60 Hold outpatient antihypertensives for now due to hypotension  Per Vascular if pt has recurrent bleeding embolization will be considered   Marda Stalker, [place] Pager [phone] (please enter 7 digits) PCCM Consult Pager [phone] (please enter 7 digits)  PCCM ATTENDING ATTESTATION: I have evaluated patient with the APP [place], reviewed database in its entirety and discussed care plan in detail. In addition, this patient was discussed on multidisciplinary rounds. I agree with the above findings, assistance and plan   [name], MD PCCM service Mobile [phone] Pager [phone] 12/27/2016 6:28 PM

## 2016-12-27 DEATH — deceased

## 2016-12-28 ENCOUNTER — Inpatient Hospital Stay: Payer: Medicare Other | Attending: Oncology

## 2016-12-28 ENCOUNTER — Inpatient Hospital Stay: Payer: Medicare Other

## 2016-12-28 DIAGNOSIS — K269 Duodenal ulcer, unspecified as acute or chronic, without hemorrhage or perforation: Secondary | ICD-10-CM

## 2016-12-28 DIAGNOSIS — D62 Acute posthemorrhagic anemia: Secondary | ICD-10-CM

## 2016-12-28 LAB — CBC WITH DIFFERENTIAL/PLATELET
BASOS ABS: 0 10*3/uL (ref 0–0.1)
BLASTS: 0 %
Band Neutrophils: 2 %
Basophils Relative: 0 %
EOS PCT: 4 %
Eosinophils Absolute: 0.3 10*3/uL (ref 0–0.7)
HEMATOCRIT: 20.4 % — AB (ref 40.0–52.0)
HEMOGLOBIN: 7 g/dL — AB (ref 13.0–18.0)
LYMPHS ABS: 1.3 10*3/uL (ref 1.0–3.6)
Lymphocytes Relative: 16 %
MCH: 31.7 pg (ref 26.0–34.0)
MCHC: 34.1 g/dL (ref 32.0–36.0)
MCV: 92.7 fL (ref 80.0–100.0)
METAMYELOCYTES PCT: 0 %
MYELOCYTES: 0 %
Monocytes Absolute: 0.7 10*3/uL (ref 0.2–1.0)
Monocytes Relative: 9 %
NEUTROS PCT: 69 %
NRBC: 0 /100{WBCs}
Neutro Abs: 6 10*3/uL (ref 1.4–6.5)
Other: 0 %
Platelets: 337 10*3/uL (ref 150–440)
Promyelocytes Absolute: 0 %
RBC: 2.2 MIL/uL — AB (ref 4.40–5.90)
RDW: 18 % — ABNORMAL HIGH (ref 11.5–14.5)
WBC: 8.3 10*3/uL (ref 3.8–10.6)

## 2016-12-28 LAB — BASIC METABOLIC PANEL
Anion gap: 5 (ref 5–15)
BUN: 81 mg/dL — AB (ref 6–20)
CHLORIDE: 110 mmol/L (ref 101–111)
CO2: 22 mmol/L (ref 22–32)
Calcium: 7.3 mg/dL — ABNORMAL LOW (ref 8.9–10.3)
Creatinine, Ser: 2.5 mg/dL — ABNORMAL HIGH (ref 0.61–1.24)
GFR calc Af Amer: 25 mL/min — ABNORMAL LOW (ref >60)
GFR, EST NON AFRICAN AMERICAN: 22 mL/min — AB (ref >60)
GLUCOSE: 111 mg/dL — AB (ref 65–99)
POTASSIUM: 4.7 mmol/L (ref 3.5–5.1)
Sodium: 137 mmol/L (ref 135–145)

## 2016-12-28 LAB — CBC
HEMATOCRIT: 19.3 % — AB (ref 40.0–52.0)
HEMOGLOBIN: 6.4 g/dL — AB (ref 13.0–18.0)
MCH: 31.1 pg (ref 26.0–34.0)
MCHC: 33.1 g/dL (ref 32.0–36.0)
MCV: 94 fL (ref 80.0–100.0)
Platelets: 346 10*3/uL (ref 150–440)
RBC: 2.05 MIL/uL — ABNORMAL LOW (ref 4.40–5.90)
RDW: 18.6 % — ABNORMAL HIGH (ref 11.5–14.5)
WBC: 8.6 10*3/uL (ref 3.8–10.6)

## 2016-12-28 LAB — PREPARE RBC (CROSSMATCH)

## 2016-12-28 MED ORDER — EPOETIN ALFA 10000 UNIT/ML IJ SOLN
10000.0000 [IU] | INTRAMUSCULAR | Status: DC
  Administered 2016-12-28: 10000 [IU] via SUBCUTANEOUS
  Filled 2016-12-28: qty 1

## 2016-12-28 MED ORDER — SODIUM CHLORIDE 0.9 % IV SOLN
Freq: Once | INTRAVENOUS | Status: AC
  Administered 2016-12-28: 21:00:00 via INTRAVENOUS

## 2016-12-28 NOTE — Progress Notes (Signed)
Patient had bright red bloody BM with clots around 1300. HGB being checked per order that was placed this am. Yevonne Aline, NP, Dr. Alice Reichert, and Darnelle Maffucci, RN (with Dr. Delana Meyer) notified. Patient's vss, no complaints currently. Diet changed back to clear liquids. No other new orders. Will continue to assess.

## 2016-12-28 NOTE — Progress Notes (Signed)
Central Telemetry called reporting 7 beat run of V-Tach.  [place], NP informed.  Will continue to monitor.

## 2016-12-28 NOTE — Progress Notes (Addendum)
   Name: Aaron Barnett MRN: [id number] DOB: Oct 01, 1927    BRIEF PATIENT DESCRIPTION:  83 year old male with acute blood loss anemia s/p upper endoscopy, placed in SDU for observation.  STUDIES:  Upper Endoscopy 10/30>>Normal esophagus. Normal stomach. One non-obstructing with adhererent clot duodenal  ulcer with adherent clot. There is no evidence of perforation. Injected, bipolar coagulation used, Clips were placed with successful hemostasis. No specimens collected.  SUBJECTIVE:  No new complaints, no distress  VITAL SIGNS: Temp:  [98.1 F (36.7 C)-99.4 F (37.4 C)] 98.6 F (37 C) (11/02 0800) Pulse Rate:  [64-82] 72 (11/02 1200) Resp:  [14-27] 24 (11/02 1200) BP: (71-126)/(41-81) 126/50 (11/02 1200) SpO2:  [95 %-100 %] 99 % (11/02 1200)  PHYSICAL EXAMINATION: Gen: NAD HEENT: NCAT, sclerae white, oropharynx normal Neck: No JVD noted Lungs: full BS, no adventitious sounds Cardiovascular: Reg (paced), no M Abdomen: Soft, NT, +BS Ext: no C/C/E Neuro: grossly intact    Recent Labs Lab 12/26/16 0441 12/27/16 0543 12/28/16 0321  NA 139 140 137  K 4.5 4.5 4.7  CL 113* 114* 110  CO2 21* 22 22  BUN 94* 86* 81*  CREATININE 2.52* 2.51* 2.50*  GLUCOSE 128* 104* 111*    Recent Labs Lab 12/27/16 0543 12/27/16 1203 12/28/16 0321  HGB 7.4* 7.4* 7.0*  HCT 21.1* 21.9* 20.4*  WBC 8.7 7.8 8.3  PLT 309 323 337   No results found.   No new CXR  ASSESSMENT / PLAN: Acute on chronic anemia with acute GI bleed  Hypotension due to acute blood loss - resolved Duodenal ulcer  AKI/CKD Hx: CHF, HTN, Biventricular ICD, and Type II Diabetes Mellitus  Transfer to MedSurg floor Advance diet and activity Monitor CBC Transfuse as needed to keep hemoglobin greater than 7.0  After transfer, PCCM will sign off. Please call if we can be of further assistance  [name], MD PCCM service Mobile [phone] Pager [phone] 12/28/2016 2:48 PM

## 2016-12-28 NOTE — Progress Notes (Signed)
Patient receives epoetin outpatient weekly, patient is supposed to receive dose today. Dr. Holley Raring notified and gave verbal for 10,000 units weekly. Order placed. Wilnette Kales

## 2016-12-28 NOTE — Progress Notes (Signed)
Patient's hgb 6.4. Yevonne Aline, NP notified. Received verbal order to transfuse 1 unit of blood. Order placed. Wilnette Kales

## 2016-12-28 NOTE — Progress Notes (Signed)
   [name] , MD [address], [place], [place], [place], [address] [address], [place], [place], [place], [address] Phone: [phone]  Fax: [phone]   Nicklaus Alviar is being followed for anemia Day 4 of follow up   Subjective: No further BM since the day of EGD. No blood transfusions needed in last 24 hrs. Pt. Denies any abdominal pain, N/V, tolerating solid food diet   Objective: Vital signs in last 24 hours: Vitals:   12/28/16 1100 12/28/16 1130 12/28/16 1139 12/28/16 1200  BP: (!) 78/55  (!) 115/48 (!) 126/50  Pulse: 67 68 73 72  Resp: 20 19 (!) 23 (!) 24  Temp:      TempSrc:      SpO2: 98% 98% 99% 99%  Weight:      Height:       Weight change:   Intake/Output Summary (Last 24 hours) at 12/28/16 1247 Last data filed at 12/28/16 1000  Gross per 24 hour  Intake                0 ml  Output              540 ml  Net             -540 ml     Exam: Heart:: Regular rate and rhythm, S1S2 present or without murmur or extra heart sounds Lungs: normal and clear to auscultation, NL resp effort Abdomen: soft, nontender, normal bowel sounds, No HSM   Lab Results: @LABTEST2 @ Micro Results: No results found for this or any previous visit (from the past 240 hour(s)). Studies/Results: No results found. Medications: I have reviewed the patient's current medications. Scheduled Meds: . cholecalciferol  2,000 Units Oral Daily  . feeding supplement (ENSURE ENLIVE)  237 mL Oral TID BM  . multivitamin-lutein  1 capsule Oral BID  . pantoprazole  40 mg Intravenous Q12H  . vitamin E  400 Units Oral Daily   Continuous Infusions: . sodium chloride     PRN Meds:.acetaminophen **OR** acetaminophen, bisacodyl, HYDROcodone-acetaminophen, ondansetron **OR** ondansetron (ZOFRAN) IV, polyethylene glycol   Assessment: Active Problems:   Anemia   Occult GI bleeding   Advance care planning   Palliative care by specialist    Plan: Continue PPI IV Bid inpatient and  pt should go home on a PO BID dosing If Hgb stays stable without need for transfusion and no further active bleeding primary team can discuss resuming aspirin before discharge with cardiology team, otherwise pt. will need follow up with cariology to resume his daily aspirin Follow up in [place] [facility] in 3-4 weeks Avoid any other NSAIDs Continue serial CBCs and transfuse PRN     LOS: 4 days   [name], MD 12/28/2016, 12:47 PM

## 2016-12-28 NOTE — Progress Notes (Signed)
Subjective: Called by RN to report "large bloody stool" after having no bm in almost 2 days. Pt s/p EGD with endoscopic hemostasis per Dr. Bonna Gains 2 days ago with reported adequate postprocedure appearance post therapy to duodenal bulb ulcer. In the last several hours, patient denies any further bowel movements or abdominal pain. He appears comfortable.  Objective: Vital signs in last 24 hours: Temp:  [98.1 F (36.7 C)-98.7 F (37.1 C)] 98.7 F (37.1 C) (11/02 1200) Pulse Rate:  [64-80] 77 (11/02 1500) Resp:  [14-27] 19 (11/02 1500) BP: (71-153)/(41-81) 153/58 (11/02 1500) SpO2:  [95 %-100 %] 98 % (11/02 1500) Last BM Date: 12/23/16  Intake/Output from previous day: 11/01 0701 - 11/02 0700 In: -  Out: 560 [Urine:560] Intake/Output this shift: Total I/O In: -  Out: 540 [Urine:540]  General appearance: alert, cooperative and appears stated age Resp: clear to auscultation bilaterally Cardio: regular rate and rhythm, S1, S2 normal, no murmur, click, rub or gallop GI: soft, non-tender; bowel sounds normal; no masses,  no organomegaly Extremities: extremities normal, atraumatic, no cyanosis or edema  Lab Results:  Recent Labs  12/27/16 1203 12/28/16 0321 12/28/16 1516  WBC 7.8 8.3 8.6  HGB 7.4* 7.0* 6.4*  HCT 21.9* 20.4* 19.3*  PLT 323 337 346   BMET  Recent Labs  12/26/16 0441 12/27/16 0543 12/28/16 0321  NA 139 140 137  K 4.5 4.5 4.7  CL 113* 114* 110  CO2 21* 22 22  GLUCOSE 128* 104* 111*  BUN 94* 86* 81*  CREATININE 2.52* 2.51* 2.50*  CALCIUM 7.5* 7.5* 7.3*   LFT No results for input(s): PROT, ALBUMIN, AST, ALT, ALKPHOS, BILITOT, BILIDIR, IBILI in the last 72 hours. PT/INR No results for input(s): LABPROT, INR in the last 72 hours. Hepatitis Panel No results for input(s): HEPBSAG, HCVAB, HEPAIGM, HEPBIGM in the last 72 hours. C-Diff No results for input(s): CDIFFTOX in the last 72 hours. No results for input(s): CDIFFPCR in the last 72 hours. Fecal  Lactopherrin No results for input(s): FECLLACTOFRN in the last 72 hours.  Studies/Results: No results found.  Medications:  I have reviewed the patient's current medications. Continuous: . sodium chloride    . sodium chloride      Assessment/Plan: 1. Duodenal bulb ulcer s/p injection and hemoclip hemostasis. Stable. Will monitor for any significant rebleeding. Continue acid suppression.  2. Anemia secondary to GI blood loss. Hgb decreased to 6.4 level. Blood transfusion ordered. Agree.  3. Will continue to follow. NO need for vascular intervention at this time.  Following.   LOS: 4 days   Olean Ree 12/28/2016, 4:51 PM

## 2016-12-28 NOTE — Progress Notes (Signed)
[facility]  ROUNDING NOTE   Subjective:  Hemoglobin has drifted down slightly today to 7.0. Creatinine has stabilized at 2.5. Remains in the critical care unit.  Objective:  Vital signs in last 24 hours:  Temp:  [98.1 F (36.7 C)-99.4 F (37.4 C)] 98.6 F (37 C) (11/02 0800) Pulse Rate:  [61-82] 73 (11/02 0930) Resp:  [14-27] 18 (11/02 0930) BP: (71-115)/(41-81) 71/52 (11/02 0930) SpO2:  [93 %-100 %] 97 % (11/02 0930)  Weight change:  Filed Weights   11/29/2016 1255 11/30/2016 0500 12/17/2016 1722  Weight: 63.5 kg (140 lb) 60.9 kg (134 lb 4.8 oz) 60 kg (132 lb 4.4 oz)    Intake/Output: I/O last 3 completed shifts: In: -  Out: 560 [Urine:560]   Intake/Output this shift:  Total I/O In: -  Out: 540 [Urine:540]  Physical Exam: General: NAD, laying in bed  Head: Normocephalic, atraumatic. Moist oral mucosal membranes  Eyes: Anicteric  Neck: Supple, trachea midline  Lungs:  Clear to auscultation, normal effort  Heart: S1S2 no rubs  Abdomen:  Soft, nontender, BS present  Extremities: no peripheral edema.  Neurologic: Nonfocal, moving all four extremities  Skin: No lesions       Basic Metabolic Panel:  Recent Labs Lab 11/28/2016 1302 12/09/2016 0343 12/26/16 0441 12/27/16 0543 12/28/16 0321  NA 135 137 139 140 137  K 4.6 4.0 4.5 4.5 4.7  CL 104 110 113* 114* 110  CO2 21* 20* 21* 22 22  GLUCOSE 161* 87 128* 104* 111*  BUN 90* 90* 94* 86* 81*  CREATININE 3.00* 2.76* 2.52* 2.51* 2.50*  CALCIUM 8.0* 7.4* 7.5* 7.5* 7.3*  MG  --   --  2.0  --   --     Liver Function Tests:  Recent Labs Lab 12/08/2016 1302  AST 26  ALT 18  ALKPHOS 26*  BILITOT 0.7  PROT 5.8*  ALBUMIN 2.8*   No results for input(s): LIPASE, AMYLASE in the last 168 hours. No results for input(s): AMMONIA in the last 168 hours.  CBC:  Recent Labs Lab 11/27/2016 1302 12/15/2016 0343 12/26/16 0441 12/26/16 1333 12/26/16 1924 12/27/16 0543 12/27/16 1203 12/28/16 0321  WBC 9.1  8.0 8.2  --   --  8.7 7.8 8.3  NEUTROABS 7.1*  --   --   --   --   --  5.5 6.0  HGB 5.0* 8.2* 6.1* 8.2* 7.9* 7.4* 7.4* 7.0*  HCT 15.5* 24.2* 17.6* 24.4* 23.0* 21.1* 21.9* 20.4*  MCV 93.1 91.5 92.9  --   --  91.0 92.0 92.7  PLT 521* 335 294  --   --  309 323 337    Cardiac Enzymes:  Recent Labs Lab 12/12/2016 1302  TROPONINI <0.03    BNP: Invalid input(s): POCBNP  CBG:  Recent Labs Lab 12/10/2016 1650 11/29/2016 1148 12/03/2016 1716  GLUCAP 116* 101* 115*    Microbiology: No results found for this or any previous visit.  Coagulation Studies: No results for input(s): LABPROT, INR in the last 72 hours.  Urinalysis: No results for input(s): COLORURINE, LABSPEC, PHURINE, GLUCOSEU, HGBUR, BILIRUBINUR, KETONESUR, PROTEINUR, UROBILINOGEN, NITRITE, LEUKOCYTESUR in the last 72 hours.  Invalid input(s): APPERANCEUR    Imaging: No results found.   Medications:   . sodium chloride     . cholecalciferol  2,000 Units Oral Daily  . feeding supplement (ENSURE ENLIVE)  237 mL Oral TID BM  . multivitamin-lutein  1 capsule Oral BID  . pantoprazole  40 mg Intravenous Q12H  .  vitamin E  400 Units Oral Daily   acetaminophen **OR** acetaminophen, bisacodyl, HYDROcodone-acetaminophen, ondansetron **OR** ondansetron (ZOFRAN) IV, polyethylene glycol  Assessment/ Plan:  Aaron Barnett is a 83 y.o. white male withcongestive heart failure and history of cardiomyopathy with biventricular ICD in place, diabetes mellitus type 2, hypertension, prostate cancer  1. Acute renal failure on chronic kidney disease stage IV:  Recent baseline Cr was 2.2 as outpt.   - Creatinine is stabilized at 2.5. We will continue to monitor.  2. Anemia of CKD with acute GI bleed hgb 4.8 at admission.   - Patient is hard multiple units of blood transfusion this admission. Hemoglobin has drifted down again to 7. Management per pulmonary/renal care.  3. Diabetes mellitus type II with chronic kidney disease:  management as per hospitalist.  4. Hypertension: Continue to hold amlodipine as well as carvedilol as blood pressure remains low at 71/52.   LOS: 4 [name] 11/2/201810:18 AM

## 2016-12-28 NOTE — Progress Notes (Signed)
Patient ID: Aaron Barnett, male   DOB: Mar 09, 1927, 83 y.o.   MRN: [id number]  [facility] PROGRESS NOTE  Aaron Barnett NWG:[id number] DOB: August 04, 1927 DOA: 12/10/2016 PCP: Aaron Harrier, MD  HPI/Subjective: Patient feeling okay.  Tolerating liquid diet.  Has not had a bowel movement.  No abdominal pain.  Objective: Vitals:   12/28/16 1139 12/28/16 1200  BP: (!) 115/48 (!) 126/50  Pulse: 73 72  Resp: (!) 23 (!) 24  Temp:    SpO2: 99% 99%    Filed Weights   12/02/2016 1255 11/30/2016 0500 12/12/2016 1722  Weight: 63.5 kg (140 lb) 60.9 kg (134 lb 4.8 oz) 60 kg (132 lb 4.4 oz)    ROS: Review of Systems  Constitutional: Negative for chills and fever.  Eyes: Negative for blurred vision.  Respiratory: Negative for cough and shortness of breath.   Cardiovascular: Negative for chest pain.  Gastrointestinal: Negative for abdominal pain, constipation, diarrhea, nausea and vomiting.  Genitourinary: Negative for dysuria.  Musculoskeletal: Negative for joint pain.  Neurological: Negative for dizziness and headaches.   Exam: Physical Exam  Constitutional: He is oriented to person, place, and time.  HENT:  Nose: No mucosal edema.  Mouth/Throat: No oropharyngeal exudate or posterior oropharyngeal edema.  Eyes: Pupils are equal, round, and reactive to light. Conjunctivae, EOM and lids are normal.  Conjunctiva pale  Neck: No JVD present. Carotid bruit is not present. No edema present. No thyroid mass and no thyromegaly present.  Cardiovascular: S1 normal and S2 normal.  Exam reveals no gallop.   No murmur heard. Pulses:      Dorsalis pedis pulses are 2+ on the right side, and 2+ on the left side.  Respiratory: No respiratory distress. He has no wheezes. He has no rhonchi. He has no rales.  GI: Soft. Bowel sounds are normal. There is no tenderness.  Musculoskeletal:       Right ankle: He exhibits no swelling.       Left ankle: He exhibits no swelling.  Lymphadenopathy:    He has no cervical  adenopathy.  Neurological: He is alert and oriented to person, place, and time. No cranial nerve deficit.  Skin: Skin is warm. No rash noted. Nails show no clubbing.  Psychiatric: He has a normal mood and affect.      Data Reviewed: Basic Metabolic Panel:  Recent Labs Lab 12/18/2016 1302 12/12/2016 0343 12/26/16 0441 12/27/16 0543 12/28/16 0321  NA 135 137 139 140 137  K 4.6 4.0 4.5 4.5 4.7  CL 104 110 113* 114* 110  CO2 21* 20* 21* 22 22  GLUCOSE 161* 87 128* 104* 111*  BUN 90* 90* 94* 86* 81*  CREATININE 3.00* 2.76* 2.52* 2.51* 2.50*  CALCIUM 8.0* 7.4* 7.5* 7.5* 7.3*  MG  --   --  2.0  --   --    Liver Function Tests:  Recent Labs Lab 12/15/2016 1302  AST 26  ALT 18  ALKPHOS 26*  BILITOT 0.7  PROT 5.8*  ALBUMIN 2.8*    CBC:  Recent Labs Lab 12/02/2016 1302 12/10/2016 0343 12/26/16 0441 12/26/16 1333 12/26/16 1924 12/27/16 0543 12/27/16 1203 12/28/16 0321  WBC 9.1 8.0 8.2  --   --  8.7 7.8 8.3  NEUTROABS 7.1*  --   --   --   --   --  5.5 6.0  HGB 5.0* 8.2* 6.1* 8.2* 7.9* 7.4* 7.4* 7.0*  HCT 15.5* 24.2* 17.6* 24.4* 23.0* 21.1* 21.9* 20.4*  MCV 93.1 91.5 92.9  --   --  91.0 92.0 92.7  PLT 521* 335 294  --   --  309 323 337   Cardiac Enzymes:  Recent Labs Lab 12/23/2016 1302  TROPONINI <0.03    CBG:  Recent Labs Lab 12/01/2016 1650 12/24/2016 1148 12/10/2016 1716  GLUCAP 116* 101* 115*    Scheduled Meds: . cholecalciferol  2,000 Units Oral Daily  . feeding supplement (ENSURE ENLIVE)  237 mL Oral TID BM  . multivitamin-lutein  1 capsule Oral BID  . pantoprazole  40 mg Intravenous Q12H  . vitamin E  400 Units Oral Daily   Continuous Infusions: . sodium chloride      Assessment/Plan:  1. Acute hemorrhagic blood loss anemia with GI bleed.  Duodenal ulcer with clot seen.  Patient given 5 units of packed red blood cells total during hospital course.Marland Kitchen Hemoglobin still on the lower side and drifting.  Patient will likely end up needing another unit of  blood at some point.  Hold aspirin and NSAIDs.  On Protonix 40 mg twice daily.  Family asked about Procrit injection.  The nurse was going to look into when this was scheduled. 2. Acute on chronic kidney disease stage IV. 3. Type 2 diabetes mellitus.  All sugars have been good 4. relative hypotension.  Hold all medications that can lower blood pressure.   5. No current signs of congestive heart failure.   Code Status:     Code Status Orders        Start     Ordered   11/29/2016 1726  Full code  Continuous     12/18/2016 1725    Code Status History    Date Active Date Inactive Code Status Order ID Comments User Context   12/07/2016  8:30 PM 12/11/2016 10:31 PM Full Code [id number]  Dustin Flock, MD Inpatient    Advance Directive Documentation     Most Recent Value  Type of Advance Directive  Healthcare Power of Attorney, Living will  Pre-existing out of facility DNR order (yellow form or pink MOST form)  -  "MOST" Form in Place?  -     Family Communication: 2 daughters and wife at the bedside Disposition Plan: To be determined   Consultants:  Critical care specialist  Cardiology  Nephrology  Gastroenterology  Procedures:  Endoscopy  Time spent: 25 minutes  [place]

## 2016-12-29 DIAGNOSIS — N179 Acute kidney failure, unspecified: Secondary | ICD-10-CM

## 2016-12-29 LAB — BASIC METABOLIC PANEL
Anion gap: 4 — ABNORMAL LOW (ref 5–15)
BUN: 79 mg/dL — ABNORMAL HIGH (ref 6–20)
CHLORIDE: 111 mmol/L (ref 101–111)
CO2: 21 mmol/L — ABNORMAL LOW (ref 22–32)
CREATININE: 2.52 mg/dL — AB (ref 0.61–1.24)
Calcium: 7.1 mg/dL — ABNORMAL LOW (ref 8.9–10.3)
GFR calc non Af Amer: 21 mL/min — ABNORMAL LOW (ref >60)
GFR, EST AFRICAN AMERICAN: 25 mL/min — AB (ref >60)
Glucose, Bld: 96 mg/dL (ref 65–99)
POTASSIUM: 4.7 mmol/L (ref 3.5–5.1)
SODIUM: 136 mmol/L (ref 135–145)

## 2016-12-29 LAB — CBC
HCT: 22.9 % — ABNORMAL LOW (ref 40.0–52.0)
Hemoglobin: 7.6 g/dL — ABNORMAL LOW (ref 13.0–18.0)
MCH: 30.3 pg (ref 26.0–34.0)
MCHC: 33.1 g/dL (ref 32.0–36.0)
MCV: 91.4 fL (ref 80.0–100.0)
PLATELETS: 319 10*3/uL (ref 150–440)
RBC: 2.5 MIL/uL — AB (ref 4.40–5.90)
RDW: 17.6 % — ABNORMAL HIGH (ref 11.5–14.5)
WBC: 8 10*3/uL (ref 3.8–10.6)

## 2016-12-29 LAB — GLUCOSE, CAPILLARY
GLUCOSE-CAPILLARY: 81 mg/dL (ref 65–99)
Glucose-Capillary: 118 mg/dL — ABNORMAL HIGH (ref 65–99)

## 2016-12-29 LAB — HEMOGLOBIN: HEMOGLOBIN: 6.8 g/dL — AB (ref 13.0–18.0)

## 2016-12-29 LAB — PREPARE RBC (CROSSMATCH)

## 2016-12-29 LAB — MRSA PCR SCREENING: MRSA BY PCR: POSITIVE — AB

## 2016-12-29 MED ORDER — INSULIN ASPART 100 UNIT/ML ~~LOC~~ SOLN
0.0000 [IU] | Freq: Three times a day (TID) | SUBCUTANEOUS | Status: DC
Start: 2016-12-29 — End: 2016-12-30

## 2016-12-29 MED ORDER — PANTOPRAZOLE SODIUM 40 MG IV SOLR
8.0000 mg/h | INTRAVENOUS | Status: DC
  Administered 2016-12-30: 8 mg/h via INTRAVENOUS
  Filled 2016-12-29: qty 80

## 2016-12-29 MED ORDER — PANTOPRAZOLE SODIUM 40 MG IV SOLR: 40.0000 mg | Freq: Two times a day (BID) | INTRAVENOUS | Status: DC

## 2016-12-29 MED ORDER — DIPHENHYDRAMINE HCL 25 MG PO CAPS
25.0000 mg | ORAL_CAPSULE | Freq: Once | ORAL | Status: AC
  Administered 2016-12-30: 25 mg via ORAL
  Filled 2016-12-29: qty 1

## 2016-12-29 MED ORDER — ACETAMINOPHEN 325 MG PO TABS
650.0000 mg | ORAL_TABLET | Freq: Once | ORAL | Status: AC
  Administered 2016-12-30: 650 mg via ORAL
  Filled 2016-12-29: qty 2

## 2016-12-29 MED ORDER — ALUM & MAG HYDROXIDE-SIMETH 200-200-20 MG/5ML PO SUSP
15.0000 mL | ORAL | Status: DC | PRN
  Administered 2016-12-29: 15 mL via ORAL
  Filled 2016-12-29 (×2): qty 30

## 2016-12-29 MED ORDER — CHLORHEXIDINE GLUCONATE CLOTH 2 % EX PADS
6.0000 | MEDICATED_PAD | Freq: Every day | CUTANEOUS | Status: DC
  Administered 2016-12-29: 6 via TOPICAL

## 2016-12-29 MED ORDER — MUPIROCIN 2 % EX OINT
1.0000 "application " | TOPICAL_OINTMENT | Freq: Two times a day (BID) | CUTANEOUS | Status: DC
  Administered 2016-12-29 (×3): 1 via NASAL
  Filled 2016-12-29: qty 22

## 2016-12-29 MED ORDER — SODIUM CHLORIDE 0.9 % IV SOLN
Freq: Once | INTRAVENOUS | Status: AC
  Administered 2016-12-30: 02:00:00 via INTRAVENOUS

## 2016-12-29 NOTE — Evaluation (Signed)
Physical Therapy Evaluation Patient Details Name: Aaron Barnett MRN: [id number] DOB: 02-25-1928 Today's Date: 12/29/2016   History of Present Illness  83 year old male with history of chronic systolic heart failure ejection fraction 50%, relative cardiomyopathy with AICD and chronic kidney disease stage III admitted with symptomatic anemia.  Clinical Impression  Pt presents to PT with generalized weakness from acute hospitalization and GI bleed.  Pt was receiving PT prior to admission and would like to continue with same after discharge.  Pt able to ambulate with RW this date for short distance of 65' and feeling fatigued after this distance.  Pt is below baseline functional mobility and would benefit from acute PT services to address objective findings.     Follow Up Recommendations Home health PT    Equipment Recommendations  None recommended by PT    Recommendations for Other Services       Precautions / Restrictions Precautions Precautions: Fall Restrictions Weight Bearing Restrictions: No      Mobility  Bed Mobility Overal bed mobility: Modified Independent             General bed mobility comments: Supien to sit with HOB elevated, able to move covers and transition to short sit with extra effort and good balance control.  Transfers Overall transfer level: Modified independent Equipment used: Rolling walker (2 wheeled)             General transfer comment: Sit<>stand easily on first attempt x2 reps using RW for support in standing.  Ambulation/Gait Ambulation/Gait assistance: Min guard Ambulation Distance (Feet): 80 Feet Assistive device: Rolling walker (2 wheeled) Gait Pattern/deviations: Step-through pattern     General Gait Details: Generally steady gait using RW, fatigues after 40' and needing to return to room for seated rest; VSS throughout gait, monitored via tele  Stairs            Wheelchair Mobility    Modified Rankin (Stroke Patients  Only)       Balance Overall balance assessment: Needs assistance Sitting-balance support: Feet supported Sitting balance-Leahy Scale: Good     Standing balance support: Bilateral upper extremity supported   Standing balance comment: Good with UE support                             Pertinent Vitals/Pain Pain Assessment: No/denies pain    Home Living Family/patient expects to be discharged to:: Private residence Living Arrangements: Spouse/significant other Available Help at Discharge: Family Type of Home: Apartment Home Access: Elevator     Home Layout: One level Home Equipment: Cane - quad      Prior Function Level of Independence: Independent with assistive device(s)         Comments: Pt is able to do all he needs, does some driving, running errands     Hand Dominance        Extremity/Trunk Assessment   Upper Extremity Assessment Upper Extremity Assessment: Generalized weakness    Lower Extremity Assessment Lower Extremity Assessment: Generalized weakness    Cervical / Trunk Assessment Cervical / Trunk Assessment: Kyphotic  Communication   Communication: No difficulties  Cognition Arousal/Alertness: Awake/alert Behavior During Therapy: WFL for tasks assessed/performed Overall Cognitive Status: Within Functional Limits for tasks assessed                                 General Comments: Plesant and answers all questions appropriately.  General Comments General comments (skin integrity, edema, etc.): visible areas intact    Exercises General Exercises - Lower Extremity Ankle Circles/Pumps: AROM;Both;10 reps [employer] Quad: AROM;Both;10 reps Hip Flexion/Marching: AROM;Both;10 reps Toe Raises: AROM;Both;10 reps;Seated Heel Raises: AROM;Both;10 reps;Seated   Assessment/Plan    PT Assessment Patient needs continued PT services  PT Problem List Decreased strength;Decreased activity tolerance;Decreased  mobility;Decreased knowledge of use of DME;Cardiopulmonary status limiting activity       PT Treatment Interventions DME instruction;Gait training;Stair training;Functional mobility training;Therapeutic activities;Therapeutic exercise;Balance training;Neuromuscular re-education;Patient/family education    PT Goals (Current goals can be found in the Care Plan section)  Acute Rehab PT Goals Patient Stated Goal: get home PT Goal Formulation: With patient Time For Goal Achievement: 01/05/17 Potential to Achieve Goals: Good    Frequency Min 2X/week   Barriers to discharge        Co-evaluation               AM-PAC PT "6 Clicks" Daily Activity  Outcome Measure Difficulty turning over in bed (including adjusting bedclothes, sheets and blankets)?: None Difficulty moving from lying on back to sitting on the side of the bed? : None Difficulty sitting down on and standing up from a chair with arms (e.g., wheelchair, bedside commode, etc,.)?: A Little Help needed moving to and from a bed to chair (including a wheelchair)?: A Little Help needed walking in hospital room?: A Little Help needed climbing 3-5 steps with a railing? : A Little 6 Click Score: 20    End of Session Equipment Utilized During Treatment: Gait belt Activity Tolerance: Patient tolerated treatment well Patient left: in chair;with call bell/phone within reach;with family/visitor present Nurse Communication: Mobility status PT Visit Diagnosis: Muscle weakness (generalized) (M62.81);Difficulty in walking, not elsewhere classified (R26.2)    Time: 3818-2993 PT Time Calculation (min) (ACUTE ONLY): 29 min   Charges:   PT Evaluation $PT Eval Low Complexity: 1 Low PT Treatments $Therapeutic Exercise: 8-22 mins   PT G Codes:   PT G-Codes **NOT FOR INPATIENT CLASS** Functional Assessment Tool Used: AM-PAC 6 Clicks Basic Mobility Functional Limitation: Mobility: Walking and moving around Mobility: Walking and Moving  Around Current Status (Z1696): At least 20 percent but less than 40 percent impaired, limited or restricted Mobility: Walking and Moving Around Goal Status [phone]): At least 1 percent but less than 20 percent impaired, limited or restricted    [employer], PT 12/29/2016, 2:59 PM

## 2016-12-29 NOTE — Progress Notes (Signed)
Pt transferring to 1A today room 158. Pt/family is aware and agreeable to transfer. Assessment is unchanged from this morning and receiving RN has been given report. Belongings sent with pt. Pt transferred via wheelchair.

## 2016-12-29 NOTE — Progress Notes (Deleted)
[place], NP notified of elevated troponin at 0.21. Acknowledged. No new orders at present.  

## 2016-12-29 NOTE — Progress Notes (Signed)
Central telemetry called to say pt had a 7-beat run of v-tach. Pt is currently resting in bed without any complaints. He denies pain, palpitations, and SOB. He denies any symptoms during time of v-tach. Dr. Alva Garnet aware, no new orders received.

## 2016-12-29 NOTE — Progress Notes (Signed)
   Name: Aaron Barnett MRN: [id number] DOB: 09-05-1927    BRIEF PATIENT DESCRIPTION:  83 year old male with acute blood loss anemia s/p upper endoscopy, placed in SDU for observation.  STUDIES:  Upper Endoscopy 10/30>>Normal esophagus. Normal stomach. One non-obstructing with adhererent clot duodenal  ulcer with adherent clot. There is no evidence of perforation. Injected, bipolar coagulation used, Clips were placed with successful hemostasis. No specimens collected.  SUBJECTIVE:  No new complaints, no distress  VITAL SIGNS: Temp:  [97.5 F (36.4 C)-99.1 F (37.3 C)] 98.1 F (36.7 C) (11/03 1151) Pulse Rate:  [64-80] 64 (11/03 1200) Resp:  [8-27] 15 (11/03 1200) BP: (113-168)/(45-107) 154/67 (11/03 1200) SpO2:  [88 %-100 %] 97 % (11/03 1200) Weight:  [61.3 kg (135 lb 2.3 oz)] 61.3 kg (135 lb 2.3 oz) (11/03 0354)  PHYSICAL EXAMINATION: Gen: NAD HEENT: NCAT, sclerae white, oropharynx normal Neck: No JVD noted Lungs: full BS, no adventitious sounds Cardiovascular: Reg (paced), no M Abdomen: Soft, NT, +BS Ext: no C/C/E Neuro: grossly intact    Recent Labs Lab 12/27/16 0543 12/28/16 0321 12/29/16 0055  NA 140 137 136  K 4.5 4.7 4.7  CL 114* 110 111  CO2 22 22 21*  BUN 86* 81* 79*  CREATININE 2.51* 2.50* 2.52*  GLUCOSE 104* 111* 96    Recent Labs Lab 12/28/16 0321 12/28/16 1516 12/29/16 0055  HGB 7.0* 6.4* 7.6*  HCT 20.4* 19.3* 22.9*  WBC 8.3 8.6 8.0  PLT 337 346 319   No results found.   No new CXR  ASSESSMENT / PLAN: Acute on chronic anemia with acute GI bleed  Hypotension due to acute blood loss - resolved Duodenal ulcer  AKI/CKD Hx: CHF, HTN, Biventricular ICD, and Type II Diabetes Mellitus  Transfer to MedSurg floor Advance diet and activity Monitor CBC Transfuse as needed to keep hemoglobin greater than 7.0  After transfer, PCCM will sign off. Please call if we can be of further assistance  [name], MD PCCM service Mobile  [phone] Pager [phone] 12/29/2016 1:04 PM

## 2016-12-29 NOTE — Progress Notes (Signed)
Dr. Jannifer Franklin notified of pt. Having hemoglobin of 6.8 and having bloody stools. D.r Jannifer Franklin ordering for blood transfusion.

## 2016-12-29 NOTE — Progress Notes (Signed)
[facility]  ROUNDING NOTE   Subjective:  Hemoglobin dropped to 6.4 yesterday but back up to 7.6 today. Renal function has plateaued with a creatinine of 2.5.  Objective:  Vital signs in last 24 hours:  Temp:  [97.5 F (36.4 C)-99.1 F (37.3 C)] 98.3 F (36.8 C) (11/03 0753) Pulse Rate:  [66-80] 70 (11/03 0900) Resp:  [8-27] 18 (11/03 0900) BP: (113-159)/(45-107) 156/58 (11/03 0900) SpO2:  [88 %-100 %] 98 % (11/03 0900) Weight:  [61.3 kg (135 lb 2.3 oz)] 61.3 kg (135 lb 2.3 oz) (11/03 0354)  Weight change:  Filed Weights   12/05/2016 0500 12/20/2016 1722 12/29/16 0354  Weight: 60.9 kg (134 lb 4.8 oz) 60 kg (132 lb 4.4 oz) 61.3 kg (135 lb 2.3 oz)    Intake/Output: I/O last 3 completed shifts: In: 352 [Blood:352] Out: 0160 [Urine:1460]   Intake/Output this shift:  Total I/O In: -  Out: 215 [Urine:215]  Physical Exam: General: NAD, laying in bed  Head: Normocephalic, atraumatic. Moist oral mucosal membranes  Eyes: Anicteric  Neck: Supple, trachea midline  Lungs:  Clear to auscultation, normal effort  Heart: S1S2 no rubs  Abdomen:  Soft, nontender, BS present  Extremities: no peripheral edema.  Neurologic: Nonfocal, moving all four extremities  Skin: No lesions       Basic Metabolic Panel:  Recent Labs Lab 12/05/2016 0343 12/26/16 0441 12/27/16 0543 12/28/16 0321 12/29/16 0055  NA 137 139 140 137 136  K 4.0 4.5 4.5 4.7 4.7  CL 110 113* 114* 110 111  CO2 20* 21* 22 22 21*  GLUCOSE 87 128* 104* 111* 96  BUN 90* 94* 86* 81* 79*  CREATININE 2.76* 2.52* 2.51* 2.50* 2.52*  CALCIUM 7.4* 7.5* 7.5* 7.3* 7.1*  MG  --  2.0  --   --   --     Liver Function Tests:  Recent Labs Lab 12/09/2016 1302  AST 26  ALT 18  ALKPHOS 26*  BILITOT 0.7  PROT 5.8*  ALBUMIN 2.8*   No results for input(s): LIPASE, AMYLASE in the last 168 hours. No results for input(s): AMMONIA in the last 168 hours.  CBC:  Recent Labs Lab 11/28/2016 1302  12/27/16 0543  12/27/16 1203 12/28/16 0321 12/28/16 1516 12/29/16 0055  WBC 9.1  < > 8.7 7.8 8.3 8.6 8.0  NEUTROABS 7.1*  --   --  5.5 6.0  --   --   HGB 5.0*  < > 7.4* 7.4* 7.0* 6.4* 7.6*  HCT 15.5*  < > 21.1* 21.9* 20.4* 19.3* 22.9*  MCV 93.1  < > 91.0 92.0 92.7 94.0 91.4  PLT 521*  < > 309 323 337 346 319  < > = values in this interval not displayed.  Cardiac Enzymes:  Recent Labs Lab 12/26/2016 1302  TROPONINI <0.03    BNP: Invalid input(s): POCBNP  CBG:  Recent Labs Lab 12/14/2016 1650 12/17/2016 1148 12/06/2016 1716  GLUCAP 116* 101* 115*    Microbiology: Results for orders placed or performed during the hospital encounter of 12/25/2016  MRSA PCR Screening     Status: Abnormal   Collection Time: 12/28/16 10:48 PM  Result Value Ref Range Status   MRSA by PCR POSITIVE (A) NEGATIVE Final    Comment:        The GeneXpert MRSA Assay (FDA approved for NASAL specimens only), is one component of a comprehensive MRSA colonization surveillance program. It is not intended to diagnose MRSA infection nor to guide or monitor treatment for  MRSA infections. RESULT CALLED TO, READ BACK BY AND VERIFIED WITH: MELISSA COBB AT 0025 ON 12/29/16 RWW     Coagulation Studies: No results for input(s): LABPROT, INR in the last 72 hours.  Urinalysis: No results for input(s): COLORURINE, LABSPEC, PHURINE, GLUCOSEU, HGBUR, BILIRUBINUR, KETONESUR, PROTEINUR, UROBILINOGEN, NITRITE, LEUKOCYTESUR in the last 72 hours.  Invalid input(s): APPERANCEUR    Imaging: No results found.   Medications:    . Chlorhexidine Gluconate Cloth  6 each Topical Q0600  . cholecalciferol  2,000 Units Oral Daily  . epoetin (EPOGEN/PROCRIT) injection  10,000 Units Subcutaneous Weekly  . feeding supplement (ENSURE ENLIVE)  237 mL Oral TID BM  . multivitamin-lutein  1 capsule Oral BID  . mupirocin ointment  1 application Nasal BID  . pantoprazole  40 mg Intravenous Q12H  . vitamin E  400 Units Oral Daily    acetaminophen **OR** [DISCONTINUED] acetaminophen, alum & mag hydroxide-simeth, bisacodyl, HYDROcodone-acetaminophen, [DISCONTINUED] ondansetron **OR** ondansetron (ZOFRAN) IV, polyethylene glycol  Assessment/ Plan:  Mr. Sy Saintjean is a 83 y.o. white male withcongestive heart failure and history of cardiomyopathy with biventricular ICD in place, diabetes mellitus type 2, hypertension, prostate cancer  1. Acute renal failure on chronic kidney disease stage IV:  Recent baseline Cr was 2.2 as outpt.   - Patient continues to have good urine output. Urine output 1.4 L. Creatinine stable at 2.5. Continue to monitor.  2. Anemia of CKD with acute GI bleed hgb 4.8 at admission.   - He will global was down to 6.4 yesterday. Back up to 7.6 today. He has received multiple blood transfusions this admission.  Continue Epogen 10,000 units subcutaneous weekly.  3. Diabetes mellitus type II with chronic kidney disease: management as per hospitalist.  4. Hypertension: Blood pressure has improved. He remains off of antihypertensives.   LOS: 5 Farrel Guimond 11/3/201811:36 AM

## 2016-12-29 NOTE — Progress Notes (Signed)
Received report from [place] in ICU. Pt arrived to room 158. Pt on room air. IV's saline locked. Skin assessed with Wilhemena Durie, RN. Pt has upper and lower dentures and left hearing aid upon assessment. No questions at this time. Will continue to monitor.

## 2016-12-29 NOTE — Progress Notes (Signed)
Patient ID: Aaron Barnett, male   DOB: January 29, 1928, 83 y.o.   MRN: [id number]  [facility] PROGRESS NOTE  Aaron Barnett [id number] DOB: Jan 06, 1928 DOA: 12/06/2016 PCP: Tracie Harrier, MD  HPI/Subjective:  No abdominal pain. No vomiting.  No bowel movement since yesterday.  Objective: Vitals:   12/29/16 1151 12/29/16 1200  BP:  (!) 154/67  Pulse:  64  Resp:  15  Temp: 98.1 F (36.7 C)   SpO2:  97%    Filed Weights   12/11/2016 0500 12/22/2016 1722 12/29/16 0354  Weight: 60.9 kg (134 lb 4.8 oz) 60 kg (132 lb 4.4 oz) 61.3 kg (135 lb 2.3 oz)    ROS: Review of Systems  Constitutional: Negative for chills and fever.  Eyes: Negative for blurred vision.  Respiratory: Negative for cough and shortness of breath.   Cardiovascular: Negative for chest pain.  Gastrointestinal: Negative for abdominal pain, constipation, diarrhea, nausea and vomiting.  Genitourinary: Negative for dysuria.  Musculoskeletal: Negative for joint pain.  Neurological: Negative for dizziness and headaches.   Exam: Physical Exam  Constitutional: He is oriented to person, place, and time.  HENT:  Nose: No mucosal edema.  Mouth/Throat: No oropharyngeal exudate or posterior oropharyngeal edema.  Eyes: Pupils are equal, round, and reactive to light. Conjunctivae, EOM and lids are normal.  Conjunctiva pale  Neck: No JVD present. Carotid bruit is not present. No edema present. No thyroid mass and no thyromegaly present.  Cardiovascular: S1 normal and S2 normal.  Exam reveals no gallop.   No murmur heard. Pulses:      Dorsalis pedis pulses are 2+ on the right side, and 2+ on the left side.  Respiratory: No respiratory distress. He has no wheezes. He has no rhonchi. He has no rales.  GI: Soft. Bowel sounds are normal. There is no tenderness.  Musculoskeletal:       Right ankle: He exhibits no swelling.       Left ankle: He exhibits no swelling.  Lymphadenopathy:    He has no cervical adenopathy.  Neurological:  He is alert and oriented to person, place, and time. No cranial nerve deficit.  Skin: Skin is warm. No rash noted. Nails show no clubbing.  Psychiatric: He has a normal mood and affect.      Data Reviewed: Basic Metabolic Panel:  Recent Labs Lab 12/07/2016 0343 12/26/16 0441 12/27/16 0543 12/28/16 0321 12/29/16 0055  NA 137 139 140 137 136  K 4.0 4.5 4.5 4.7 4.7  CL 110 113* 114* 110 111  CO2 20* 21* 22 22 21*  GLUCOSE 87 128* 104* 111* 96  BUN 90* 94* 86* 81* 79*  CREATININE 2.76* 2.52* 2.51* 2.50* 2.52*  CALCIUM 7.4* 7.5* 7.5* 7.3* 7.1*  MG  --  2.0  --   --   --    Liver Function Tests:  Recent Labs Lab 12/17/2016 1302  AST 26  ALT 18  ALKPHOS 26*  BILITOT 0.7  PROT 5.8*  ALBUMIN 2.8*    CBC:  Recent Labs Lab 12/14/2016 1302  12/27/16 0543 12/27/16 1203 12/28/16 0321 12/28/16 1516 12/29/16 0055  WBC 9.1  < > 8.7 7.8 8.3 8.6 8.0  NEUTROABS 7.1*  --   --  5.5 6.0  --   --   HGB 5.0*  < > 7.4* 7.4* 7.0* 6.4* 7.6*  HCT 15.5*  < > 21.1* 21.9* 20.4* 19.3* 22.9*  MCV 93.1  < > 91.0 92.0 92.7 94.0 91.4  PLT 521*  < > 309  323 337 346 319  < > = values in this interval not displayed. Cardiac Enzymes:  Recent Labs Lab 12/06/2016 1302  TROPONINI <0.03    CBG:  Recent Labs Lab 12/06/2016 1650 12/24/2016 1148 12/16/2016 1716  GLUCAP 116* 101* 115*    Scheduled Meds: . Chlorhexidine Gluconate Cloth  6 each Topical Q0600  . cholecalciferol  2,000 Units Oral Daily  . epoetin (EPOGEN/PROCRIT) injection  10,000 Units Subcutaneous Weekly  . feeding supplement (ENSURE ENLIVE)  237 mL Oral TID BM  . multivitamin-lutein  1 capsule Oral BID  . mupirocin ointment  1 application Nasal BID  . pantoprazole  40 mg Intravenous Q12H  . vitamin E  400 Units Oral Daily   Continuous Infusions:   Assessment/Plan:  1. Acute blood loss anemia due to upper GI bleed.  EGD showed duodenal ulcer with a clot.  Received 5 units packed RBC this admission.  Hemoglobin today  7.6. 2. AKI over CKD4.  Improved.  Creatinine stabilized at 2.5. 3. Type 2 diabetes mellitus.  Well controlled 4. Hypovolemic hypotension due to GI bleed.  Improved.  Blood pressure medications on hold. 5. No current signs of congestive heart failure.    Code Status:     Code Status Orders        Start     Ordered   12/26/2016 1726  Full code  Continuous     11/27/2016 1725    Code Status History    Date Active Date Inactive Code Status Order ID Comments User Context   12/07/2016  8:30 PM 12/11/2016 10:31 PM Full Code [id number]  Dustin Flock, MD Inpatient    Advance Directive Documentation     Most Recent Value  Type of Advance Directive  Healthcare Power of Attorney, Living will  Pre-existing out of facility DNR order (yellow form or pink MOST form)  -  "MOST" Form in Place?  -     Family Communication:  wife at the bedside. Disposition Plan: To be determined.  Consultants:  Critical care specialist  Cardiology  Nephrology  Gastroenterology  Procedures:  Endoscopy  Time spent: 30 minutes  [place], [place]

## 2016-12-29 NOTE — Progress Notes (Signed)
Subjective: Since I last evaluated the patient he has had no significant re-bleeding. He is being transferred to regular medical bed at this time.  Objective: Vital signs in last 24 hours: Temp:  [97.5 F (36.4 C)-99.1 F (37.3 C)] 97.7 F (36.5 C) (11/03 1430) Pulse Rate:  [64-80] 71 (11/03 1430) Resp:  [8-27] 15 (11/03 1200) BP: (126-168)/(49-107) 156/62 (11/03 1430) SpO2:  [88 %-100 %] 100 % (11/03 1430) Weight:  [61.3 kg (135 lb 2.3 oz)] 61.3 kg (135 lb 2.3 oz) (11/03 0354) Last BM Date: 12/28/16  Intake/Output from previous day: 11/02 0701 - 11/03 0700 In: 352 [Blood:352] Out: 0932 [Urine:1460] Intake/Output this shift: Total I/O In: -  Out: 365 [Urine:365]  General appearance: alert, cooperative and appears stated age GI: soft, non-tender; bowel sounds normal; no masses,  no organomegaly  Lab Results:  Recent Labs  12/28/16 0321 12/28/16 1516 12/29/16 0055  WBC 8.3 8.6 8.0  HGB 7.0* 6.4* 7.6*  HCT 20.4* 19.3* 22.9*  PLT 337 346 319   BMET  Recent Labs  12/27/16 0543 12/28/16 0321 12/29/16 0055  NA 140 137 136  K 4.5 4.7 4.7  CL 114* 110 111  CO2 22 22 21*  GLUCOSE 104* 111* 96  BUN 86* 81* 79*  CREATININE 2.51* 2.50* 2.52*  CALCIUM 7.5* 7.3* 7.1*   LFT No results for input(s): PROT, ALBUMIN, AST, ALT, ALKPHOS, BILITOT, BILIDIR, IBILI in the last 72 hours. PT/INR No results for input(s): LABPROT, INR in the last 72 hours. Hepatitis Panel No results for input(s): HEPBSAG, HCVAB, HEPAIGM, HEPBIGM in the last 72 hours. C-Diff No results for input(s): CDIFFTOX in the last 72 hours. No results for input(s): CDIFFPCR in the last 72 hours. Fecal Lactopherrin No results for input(s): FECLLACTOFRN in the last 72 hours.  Studies/Results: No results found.  Medications: I have reviewed the patient's current medications.  Assessment/Plan: 1. GI bleed secondary to duodenal bulb ulcer. Stable s/p injection and hemoclip hemostasis by Dr. Bonna Gains.  Continuing acid suppression. Following.   LOS: 5 days   Olean Ree 12/29/2016, 2:31 PM

## 2016-12-30 ENCOUNTER — Inpatient Hospital Stay: Payer: Medicare Other

## 2016-12-30 DIAGNOSIS — D62 Acute posthemorrhagic anemia: Secondary | ICD-10-CM

## 2016-12-30 DIAGNOSIS — K269 Duodenal ulcer, unspecified as acute or chronic, without hemorrhage or perforation: Secondary | ICD-10-CM

## 2016-12-30 DIAGNOSIS — N179 Acute kidney failure, unspecified: Secondary | ICD-10-CM

## 2016-12-30 LAB — HEMOGLOBIN A1C
HEMOGLOBIN A1C: 5.1 % (ref 4.8–5.6)
MEAN PLASMA GLUCOSE: 99.67 mg/dL

## 2016-12-30 LAB — BASIC METABOLIC PANEL
Anion gap: 10 (ref 5–15)
BUN: 84 mg/dL — AB (ref 6–20)
CALCIUM: 7 mg/dL — AB (ref 8.9–10.3)
CO2: 15 mmol/L — AB (ref 22–32)
CREATININE: 2.67 mg/dL — AB (ref 0.61–1.24)
Chloride: 109 mmol/L (ref 101–111)
GFR calc Af Amer: 23 mL/min — ABNORMAL LOW (ref >60)
GFR, EST NON AFRICAN AMERICAN: 20 mL/min — AB (ref >60)
GLUCOSE: 312 mg/dL — AB (ref 65–99)
Potassium: 5.8 mmol/L — ABNORMAL HIGH (ref 3.5–5.1)
Sodium: 134 mmol/L — ABNORMAL LOW (ref 135–145)

## 2016-12-30 LAB — GLUCOSE, CAPILLARY
GLUCOSE-CAPILLARY: 238 mg/dL — AB (ref 65–99)
GLUCOSE-CAPILLARY: 226 mg/dL — AB (ref 65–99)

## 2016-12-30 LAB — CBC
HCT: 21.9 % — ABNORMAL LOW (ref 40.0–52.0)
HEMOGLOBIN: 7.3 g/dL — AB (ref 13.0–18.0)
MCH: 31.3 pg (ref 26.0–34.0)
MCHC: 33.2 g/dL (ref 32.0–36.0)
MCV: 94.3 fL (ref 80.0–100.0)
Platelets: 188 10*3/uL (ref 150–440)
RBC: 2.32 MIL/uL — ABNORMAL LOW (ref 4.40–5.90)
RDW: 17.1 % — AB (ref 11.5–14.5)
WBC: 10.8 10*3/uL — ABNORMAL HIGH (ref 3.8–10.6)

## 2016-12-30 MED ORDER — PNEUMOCOCCAL VAC POLYVALENT 25 MCG/0.5ML IJ INJ: 0.5000 mL | INJECTION | INTRAMUSCULAR | Status: DC

## 2016-12-30 MED ORDER — VASOPRESSIN 20 UNIT/ML IV SOLN
0.0300 [IU]/min | INTRAVENOUS | Status: DC
  Administered 2016-12-30: 0.03 [IU]/min via INTRAVENOUS
  Filled 2016-12-30: qty 2

## 2016-12-30 MED ORDER — NOREPINEPHRINE BITARTRATE 1 MG/ML IV SOLN
0.0000 ug/min | INTRAVENOUS | Status: DC
  Administered 2016-12-30: 15 ug/min via INTRAVENOUS
  Filled 2016-12-30: qty 4

## 2016-12-30 MED ORDER — ORAL CARE MOUTH RINSE: 15.0000 mL | OROMUCOSAL | Status: DC

## 2016-12-30 MED ORDER — NOREPINEPHRINE BITARTRATE 1 MG/ML IV SOLN
0.0000 ug/min | INTRAVENOUS | Status: DC
  Filled 2016-12-30: qty 16

## 2016-12-30 MED ORDER — DEXTROSE 50 % IV SOLN: 1.0000 | Freq: Once | INTRAVENOUS | Status: DC

## 2016-12-30 MED ORDER — FENTANYL 2500MCG IN NS 250ML (10MCG/ML) PREMIX INFUSION
200.0000 ug/h | INTRAVENOUS | Status: DC
  Administered 2016-12-30: 200 ug/h via INTRAVENOUS

## 2016-12-30 MED ORDER — CALCIUM GLUCONATE 10 % IV SOLN
1.0000 g | Freq: Once | INTRAVENOUS | Status: DC
  Filled 2016-12-30: qty 10

## 2016-12-30 MED ORDER — NOREPINEPHRINE BITARTRATE 1 MG/ML IV SOLN
12.0000 ug/min | INTRAVENOUS | Status: DC
  Administered 2016-12-30: 15 ug/min via INTRAVENOUS
  Filled 2016-12-30: qty 4

## 2016-12-30 MED ORDER — INSULIN ASPART 100 UNIT/ML IV SOLN
10.0000 [IU] | Freq: Once | INTRAVENOUS | Status: DC
  Filled 2016-12-30: qty 0.1

## 2016-12-30 MED ORDER — NOREPINEPHRINE BITARTRATE 1 MG/ML IV SOLN: 12.0000 ug/min | INTRAVENOUS | Status: DC

## 2016-12-30 MED ORDER — FENTANYL 2500MCG IN NS 250ML (10MCG/ML) PREMIX INFUSION
INTRAVENOUS | Status: AC
  Administered 2016-12-30: 200 ug/h via INTRAVENOUS
  Filled 2016-12-30: qty 250

## 2016-12-30 MED ORDER — ARTIFICIAL TEARS OPHTHALMIC OINT
TOPICAL_OINTMENT | OPHTHALMIC | Status: DC | PRN
  Filled 2016-12-30: qty 3.5

## 2016-12-30 MED ORDER — CHLORHEXIDINE GLUCONATE 0.12% ORAL RINSE (MEDLINE KIT): 15.0000 mL | Freq: Two times a day (BID) | OROMUCOSAL | Status: DC

## 2016-12-30 MED ORDER — FENTANYL 2500MCG IN NS 250ML (10MCG/ML) PREMIX INFUSION: 200.0000 ug/h | INTRAVENOUS | Status: DC

## 2016-12-30 MED FILL — Medication: Qty: 1 | Status: AC

## 2016-12-31 ENCOUNTER — Telehealth: Payer: Self-pay | Admitting: Pulmonary Disease

## 2016-12-31 ENCOUNTER — Other Ambulatory Visit: Payer: Self-pay | Admitting: Cardiology

## 2016-12-31 LAB — TYPE AND SCREEN
ABO/RH(D): O POS
ANTIBODY SCREEN: NEGATIVE
UNIT DIVISION: 0
Unit division: 0

## 2016-12-31 LAB — BPAM RBC
Blood Product Expiration Date: 201811122359
Blood Product Expiration Date: 201811302359
ISSUE DATE / TIME: 201811022058
ISSUE DATE / TIME: 201811040128
UNIT TYPE AND RH: 5100
UNIT TYPE AND RH: 9500

## 2016-12-31 MED FILL — Medication: Qty: 1 | Status: AC

## 2016-12-31 NOTE — Telephone Encounter (Signed)
[place] dropped of Death Certificate to be signed Placed in Nurse Box

## 2016-12-31 NOTE — Telephone Encounter (Signed)
Death certificate placed in DS folder. 

## 2017-01-02 NOTE — Telephone Encounter (Signed)
[facility] home notified death certificate ready for pick-up.

## 2017-01-04 ENCOUNTER — Inpatient Hospital Stay: Payer: Medicare Other

## 2017-01-11 ENCOUNTER — Inpatient Hospital Stay: Payer: Medicare Other

## 2017-01-16 ENCOUNTER — Ambulatory Visit: Payer: Medicare Other | Admitting: Oncology

## 2017-01-16 ENCOUNTER — Other Ambulatory Visit: Payer: Medicare Other

## 2017-01-16 ENCOUNTER — Ambulatory Visit: Payer: Medicare Other

## 2017-01-25 ENCOUNTER — Ambulatory Visit: Payer: Medicare Other

## 2017-01-25 ENCOUNTER — Other Ambulatory Visit: Payer: Medicare Other

## 2017-01-25 ENCOUNTER — Ambulatory Visit: Payer: Medicare Other | Admitting: Oncology

## 2017-01-26 NOTE — Procedures (Signed)
Central Venous Catheter Insertion Procedure Note Meziah Blasingame [id number] February 13, 1928  Procedure: Insertion of Central Venous Catheter Indications: Assessment of intravascular volume, Drug and/or fluid administration and Frequent blood sampling  Procedure Details Consent: Emergent post cardiac arrest  Time Out: Verified patient identification, verified procedure, site/side was marked, verified correct patient position, special equipment/implants available, medications/allergies/relevent history reviewed, required imaging and test results available.  Performed  Maximum sterile technique was used including antiseptics, cap, gloves, gown, hand hygiene, mask and sheet. Skin prep: Chlorhexidine; local anesthetic administered A antimicrobial bonded/coated triple lumen catheter was placed in the right femoral vein due to emergent situation using the Seldinger technique.  Evaluation Blood flow good Complications: No apparent complications Patient did tolerate procedure well. Chest X-ray ordered to verify placement.  CXR: not indicated.  Emergent right femoral central line placed utilizing ultrasound no complications noted during or following procedure.  Marda Stalker, [place] Pager [phone] (please enter 7 digits) [place] Pager [phone] (please enter 7 digits)  [name], MD PCCM service Mobile [phone] Pager [phone] 01-07-17 8:55 AM

## 2017-01-26 NOTE — Discharge Summary (Signed)
DEATH SUMMARY  DATE OF ADMISSION:  01/10/2017  DATE OF DISCHARGE/DEATH:  01/16/17  ADMISSION DIAGNOSES:   Symptomatic acute on chronic anemia: Acute on chronic kidney failure stage III Dilated cardiomyopathy Essential hypertension   DISCHARGE DIAGNOSES:   Symptomatic acute on chronic anemia: Acute on chronic kidney failure stage III Dilated cardiomyopathy Essential hypertension Acute blood loss anemia requiring multiple transfusions Duodenal ulcer Essential hypertension Hypotension Type 2 diabetes   PRESENTATION:   Pt was admitted with the following HPI and the above admission diagnoses:  Aaron Barnett  is a 83 y.o. male with a known history of  CKD stage 3,chronic systolic heart failure ejection fraction 50%, DM, dilated cardiomyopathy status post AICD, who presented to hematologist office due to low hemoglobin and fatigue.  Patient reports over the past 3 days he has had increasing shortness of breath and fatigue.  His wife noticed that he is very pale.  She called the hematologist office for follow-up appointment.  Apparently his hemoglobin was 4.6 in the office. He is guaiac positive in the emergency room. Of note patient was hospitalized a few weeks ago due to acute on chronic kidney disease as well as anemia.  He did receive 1 unit of packed red blood cells during the hospitalization.  Apparently he was guaiac negative and did not want GI follow-up at that time.   HOSPITAL COURSE:   Jan 11, 2023 Admitted with above history.  Transfused 2 units PRBCs 01/11/2023 Gastroenterology consultation and EGD: Duodenal ulcer with adherent clot 10/30 Pulm/CCM consultation 10/31 palliative care consultation 10/31 vascular surgery consultation 11/01 cardiology consultation 11/01 nephrology consultation 11/01 remained in stepdown unit.  Diet initiated.  Tolerating well. 10/31 transfused 1 unit PRBCs 11/02 transfer out of the ICU/SDU as planned.  However, had large bloody bowel movement and transfer was  canceled. Transfused 1 unit PRBCs 11/03 transfused 1 unit PRBCs.  Transferred out of ICU/SDU.  Diet advanced.  Activity advanced 2023-01-17 suffered cardiac arrest with initial rhythm identified as ventricular fibrillation.  Underwent ACLS under the direction of ED physician.  Intubated and transferred to ICU.Marland Kitchen Suffered 2 further episodes of cardiac arrest with PEA and ventricular arrhythmias.  Dr. Alva Garnet spoke with patient's family and decision was made for no further ACLS.  He passed away shortly thereafter peacefully   Cause of death:  Ventricular fibrillation due to ischemic cardiomyopathy  Contributing factors: Type 2 diabetes, AKI, GI bleed, acute blood loss anemia  Autopsy:  No  [name], MD PCCM service Mobile [phone] Pager [phone] 01/02/2017 1:18 PM

## 2017-01-26 NOTE — Progress Notes (Signed)
Pt received one unit of blood, and is continuing to have bloody stools, will continue to monitor.

## 2017-01-26 NOTE — Progress Notes (Signed)
RT at bedside when code blue called, removed patient from ventilator and manually ventilated for duration of code.

## 2017-01-26 NOTE — Progress Notes (Signed)
Code Blue called. Patient found with agonal respirations and intermittently absent pulse. Admitted for GI bleed. PMH of DM2, CKD, HTN and CHF. CPR in progress upon my arrival. Epi x2 given. ETT placed on second attempt. Atropine and bicarb x1 each. 2 shocks after Vfib and Vtach. Return of circulation achieved after 10 min. Dopamine started and patient transferred to the ICU. CXR ordered. K elevated. Calcium gluconate, D50 and insulin ordered.

## 2017-01-26 NOTE — Progress Notes (Signed)
[place] responded to a Code Blue. [place] joined the MD in the waiting area with Family. As we spoke, a second MD entered and informed that the Pt had passed. [place] spent time with family as they waited to go see the Pt. [place] escorted family to view the Pt and provided prayer and a comforting presence. [place] escorted family to the elevator and had a final prayer before they exited.    01/18/17 0900  Clinical Encounter Type  Visited With Family;Health care provider  Visit Type Code (Code blue)  Referral From Nurse  Spiritual Encounters  Spiritual Needs Prayer;Emotional;Grief support

## 2017-01-26 NOTE — ED Provider Notes (Signed)
[facility]  Department of Emergency Medicine   Code Blue CONSULT NOTE  Chief Complaint: Cardiac arrest/unresponsive   Level V Caveat: Unresponsive  History of present illness: I was contacted by the hospital for a CODE BLUE cardiac arrest upstairs and presented to the patient's bedside.  Patient is an 83-year-old male who was admitted to the hospital with a GI bleed. The staff states that the patient was unresponsive and agonal on their arrival to evaluate him   ROS: Unable to obtain, Level V caveat  Scheduled Meds: . calcium gluconate  1 g Intravenous Once  . chlorhexidine gluconate (MEDLINE KIT)  15 mL Mouth Rinse BID  . Chlorhexidine Gluconate Cloth  6 each Topical Q0600  . cholecalciferol  2,000 Units Oral Daily  . dextrose  1 ampule Intravenous Once  . epoetin (EPOGEN/PROCRIT) injection  10,000 Units Subcutaneous Weekly  . feeding supplement (ENSURE ENLIVE)  237 mL Oral TID BM  . insulin aspart  0-9 Units Subcutaneous TID WC  . insulin aspart  10 Units Intravenous Once  . mouth rinse  15 mL Mouth Rinse 10 times per day  . multivitamin-lutein  1 capsule Oral BID  . mupirocin ointment  1 application Nasal BID  . [START ON 01/02/2017] pantoprazole  40 mg Intravenous Q12H  . [START ON 12/31/2016] pneumococcal 23 valent vaccine  0.5 mL Intramuscular Tomorrow-1000  . vitamin E  400 Units Oral Daily   Continuous Infusions: . fentaNYL    . fentaNYL infusion INTRAVENOUS 200 mcg/hr (01/14/2017 0747)  . norepinephrine (LEVOPHED) Adult infusion    . pantoprozole (PROTONIX) infusion 8 mg/hr (01/01/2017 0000)  . vasopressin (PITRESSIN) infusion - *FOR SHOCK*     PRN Meds:.acetaminophen **OR** [DISCONTINUED] acetaminophen, alum & mag hydroxide-simeth, bisacodyl, HYDROcodone-acetaminophen, [DISCONTINUED] ondansetron **OR** ondansetron (ZOFRAN) IV, polyethylene glycol Past Medical History:  Diagnosis Date  . CHF (congestive heart failure) (HCC)   . Diabetes mellitus without  complication (HCC)   . HTN (hypertension)   . Prostate cancer (HCC)   . Skin cancer    Past Surgical History:  Procedure Laterality Date  . PROSTATECTOMY     Social History   Socioeconomic History  . Marital status: Married    Spouse name: Not on file  . Number of children: Not on file  . Years of education: Not on file  . Highest education level: Not on file  Social Needs  . Financial resource strain: Not on file  . Food insecurity - worry: Not on file  . Food insecurity - inability: Not on file  . Transportation needs - medical: Not on file  . Transportation needs - non-medical: Not on file  Occupational History  . Not on file  Tobacco Use  . Smoking status: Former Smoker    Types: Cigarettes  . Smokeless tobacco: Never Used  Substance and Sexual Activity  . Alcohol use: Yes  . Drug use: No  . Sexual activity: Not on file  Other Topics Concern  . Not on file  Social History Narrative  . Not on file   Allergies  Allergen Reactions  . Doxycycline Rash  . Lisinopril Rash  . Penicillins Rash    Has patient had a PCN reaction causing immediate rash, facial/tongue/throat swelling, SOB or lightheadedness with hypotension: Yes Has patient had a PCN reaction causing severe rash involving mucus membranes or skin necrosis: No Has patient had a PCN reaction that required hospitalization: No Has patient had a PCN reaction occurring within the last 10 years: No   If all of the above answers are "NO", then may proceed with Cephalosporin use.    Last set of Vital Signs (not current) Vitals:   12/27/2016 0700 01/09/2017 0715  BP: (!) 60/49 (!) 75/41  Pulse:    Resp: 18 15  Temp:    SpO2:        Physical Exam  Gen: unresponsive Cardiovascular: pulseless  Resp: agonal respirations. Breath sounds equal bilaterally with bagging  Abd: nondistended  Neuro: GCS 3, unresponsive to pain  HEENT: No blood in posterior pharynx, gag reflex absent  Neck: No crepitus   Musculoskeletal: No deformity  Skin: warm  Procedures  INTUBATION Performed by: Webster,  Allison P Required items: required blood products, implants, devices, and special equipment available Patient identity confirmed: provided demographic data and hospital-assigned identification number Time out: Immediately prior to procedure a "time out" was called to verify the correct patient, procedure, equipment, support staff and site/side marked as required. Indications: Unresponsive Intubation method: glidescope Preoxygenation: BVM Sedatives: none Paralytic: none Tube Size: 8.0 cuffed Post-procedure assessment: chest rise and ETCO2 monitor Breath sounds: equal and absent over the epigastrium Tube secured by Respiratory Therapy Patient tolerated the procedure well with no immediate complications.  CRITICAL CARE Performed by: Webster,  Allison P Total critical care time: 20 Critical care time was exclusive of separately billable procedures and treating other patients. Critical care was necessary to treat or prevent imminent or life-threatening deterioration. Critical care was time spent personally by me on the following activities: development of treatment plan with patient and/or surrogate as well as nursing, discussions with consultants, evaluation of patient's response to treatment, examination of patient, obtaining history from patient or surrogate, ordering and performing treatments and interventions, ordering and review of laboratory studies, ordering and review of radiographic studies, pulse oximetry and re-evaluation of patient's condition.  Cardiopulmonary Resuscitation (CPR) Procedure Note  Directed/Performed by: Webster,  Allison P I personally directed ancillary staff and/or performed CPR in an effort to regain return of spontaneous circulation and to maintain cardiac, neuro and systemic perfusion.    Medical Decision making  When we arrived the patient was receiving chest  compressions. We gave the patient a dose of epinephrine IM. I also had the staff start a liter of normal saline on the patient. We continued giving compressions and intubated the patient. We did a rhythm check and it was found that the patient was in ventricular tachycardia. At that time we decided to shock the patient. The patient did regain pulses for a short while but they disappeared again. He resumed compressions and gave him some more epinephrine. We also gave the patient some sodium bicarbonate in it. We were able to successfully intubate the patient as initially we did not have good color change on the end-tidal. We did a subsequent rhythm check and again the patient was found to be in ventricular tachycardia. The patient did receive a second shock and we did resumed compressions. We were able to get pulses again and the patient did receive some atropine and then some dopamine. His care was then turned over to the hospitalist physician.  Assessment and Plan  CODE BLUE Return of spontaneous circulation    Webster, Allison P, MD 01/13/2017 0815  

## 2017-01-26 NOTE — Progress Notes (Addendum)
Pt to unit approx 0745, S/P PEA arrest, Code Blue, and intubated on 1A, report received from 1A RN, who stated pt was anemic last nite and c/o dyspnea, although O2 sats were WDL.  She started O2 at 2 liters for comfort and transfused I unit PRBCs early this AM for hemoglobin 6.8.  Afterwards she entered room to speak with him and she stated he "just went out on me", and became unresponsive.  They called a code blue when they could not paplpate a pulse.  He was intubated and when he achieved ROSC they transported him to ICU.  Pt's eyes remained open but he was unresponsive to Probation officer.  NP inserted a rt fem CL and we started levophed approx 0730.  Titrated up at 0800 as SBP dropped into the 30s, added vaso approx 0745.  Placed bair hugger for hypothermia.  RT concerned about ETCO2 of 17.  Writer could no longer palpate a carotid or femoral pulse.  Code blue called 0813, see Code blue sheet for details. ROSC not achieved. Dr Alva Garnet called TOD at [phone].  Patient and room cleaned for family.  Wife and daughters visited with him and took his belongings from the bedside.  CDS notified at 0905, see Post Mortem flowsheet for details. Released to the morgue

## 2017-01-26 NOTE — Significant Event (Signed)
I walked into ICU and was immediately directed to Mr Ruscitti's room where he had again suffered cardiac arrest. His rhythm was paced. He was pulseless. He received 3 rounds of epi and approx 10 mins of chest compressions. At that point, it was deemed medically futile to continue ACLS and resuscitative efforts were discontinued. I spoke with pt's wife and informed her. She was accepting of this information.   [name], MD PCCM service Mobile [phone] Pager [phone] 2017-01-03 9:04 AM

## 2017-01-26 DEATH — deceased

## 2017-01-30 ENCOUNTER — Other Ambulatory Visit: Payer: Self-pay | Admitting: Nurse Practitioner

## 2019-06-10 IMAGING — CT CT RENAL STONE PROTOCOL
2 of 4 series · 15 of 46 positions shown, 17 images · non-contrast
Comparison: 04/18/2015 CT Abdomen and Pelvis

CLINICAL DATA: 89-year-old male with weakness, generalized pain,
anemia, renal failure.

EXAM:
CT ABDOMEN AND PELVIS WITHOUT CONTRAST
TECHNIQUE: Multidetector CT imaging of the abdomen and pelvis was performed
following the standard protocol without IV contrast.

[Series 2: stone full standard · axial · 0.86mm/px · z∈[-486,-121]mm · 12 of 87 slices shown, 14 images]
[im 7/87  soft-tissue]
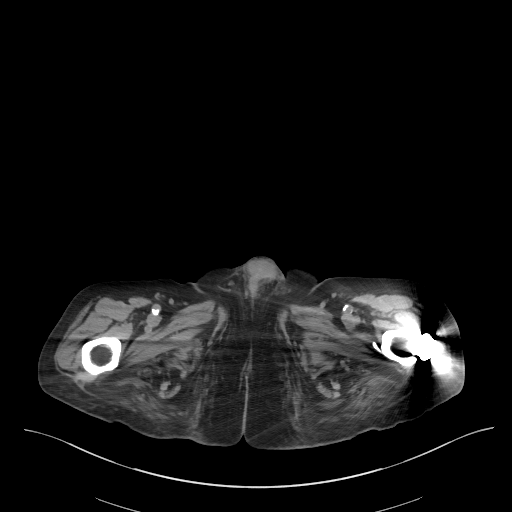
[im 7/87  bone]
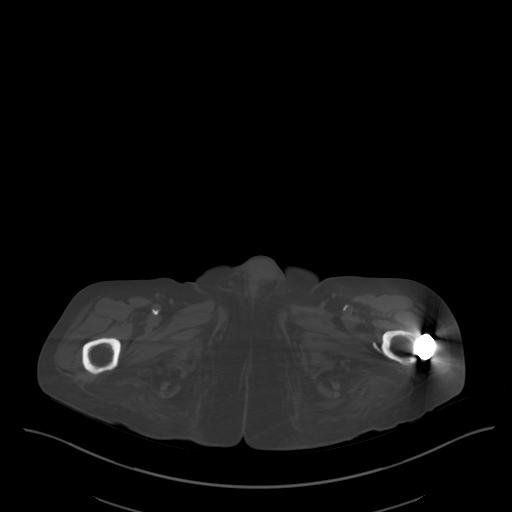
[im 14/87  soft-tissue]
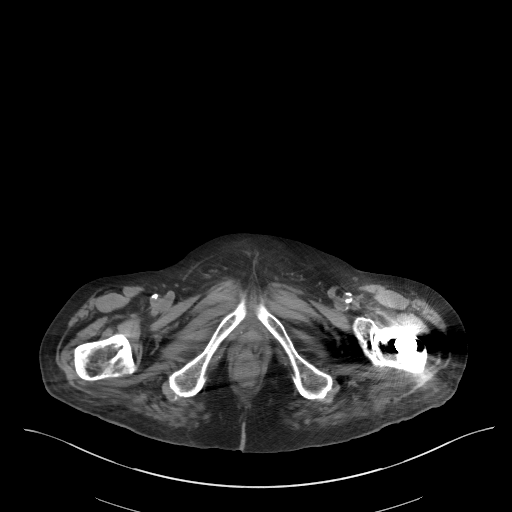
[im 20/87  soft-tissue]
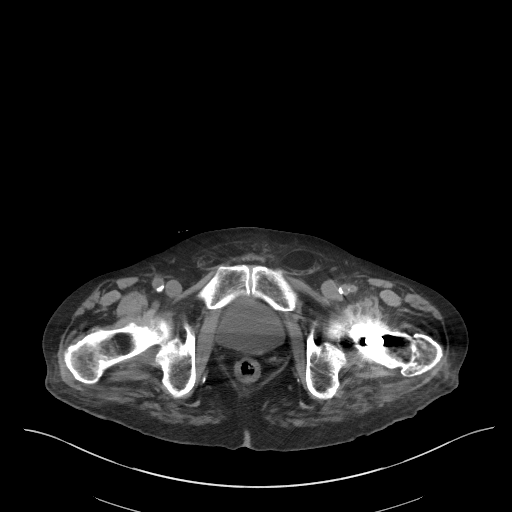
[im 27/87  soft-tissue]
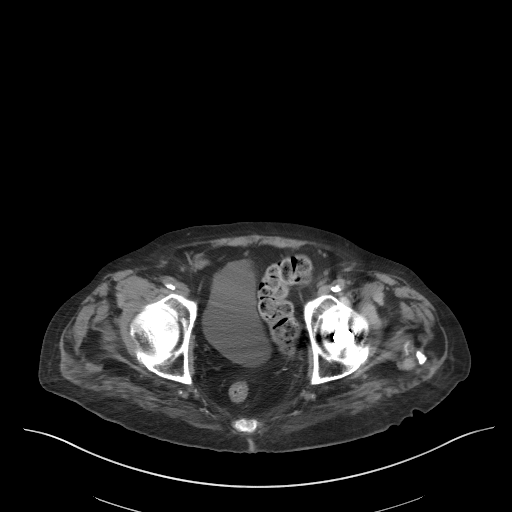
[im 34/87  soft-tissue]
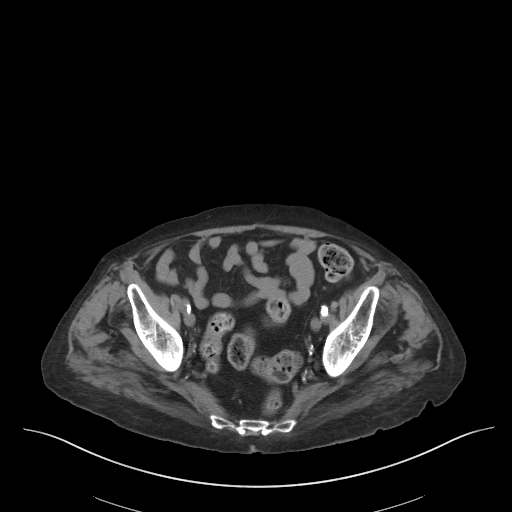
[im 40/87  soft-tissue]
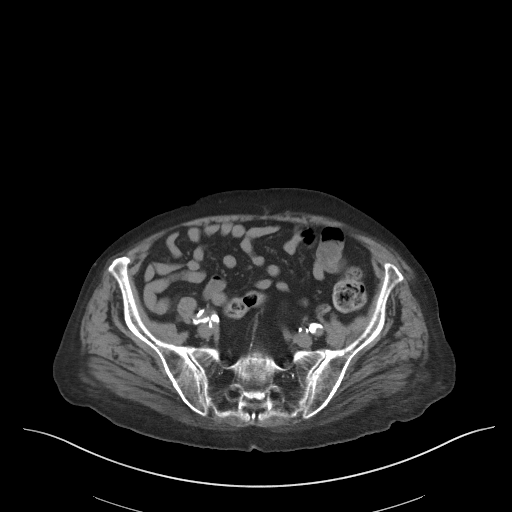
[im 47/87  soft-tissue]
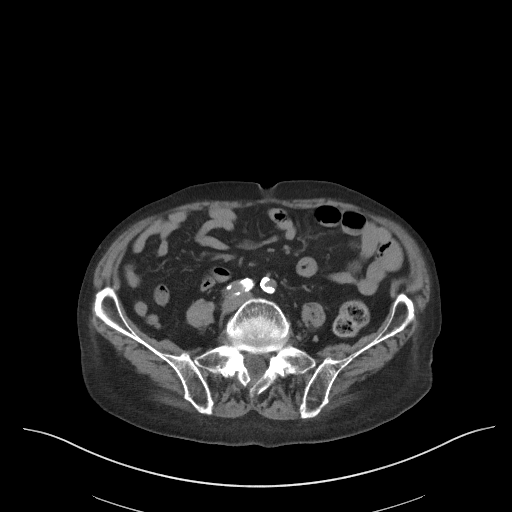
[im 53/87  soft-tissue]
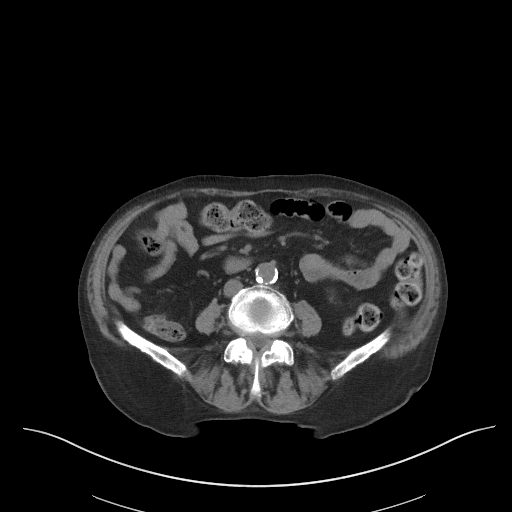
[im 60/87  soft-tissue]
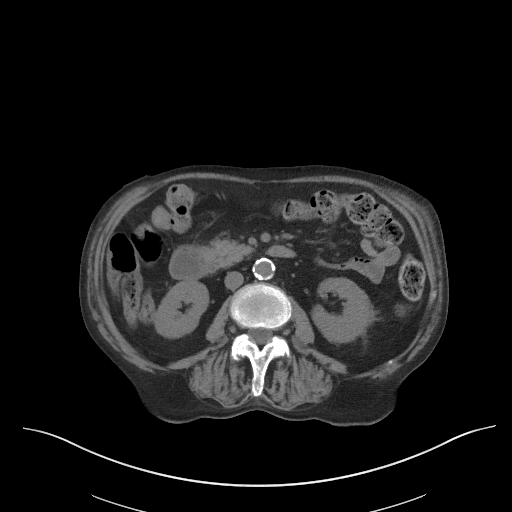
[im 60/87  bone]
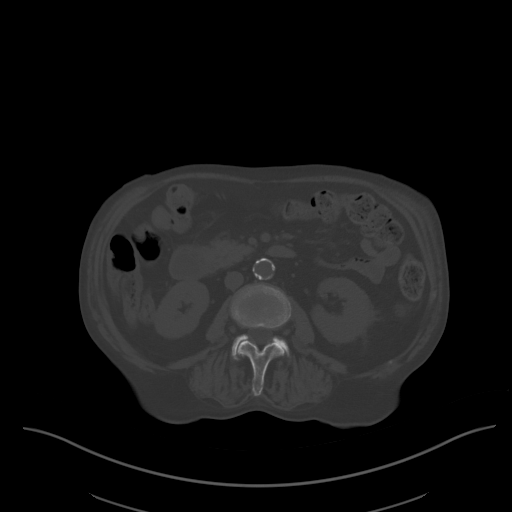
[im 67/87  soft-tissue]
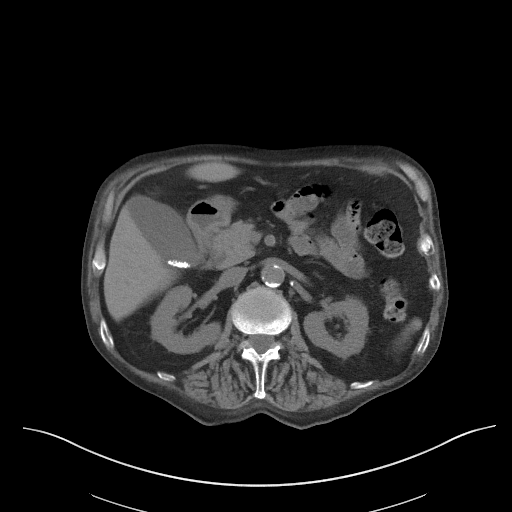
[im 73/87  soft-tissue]
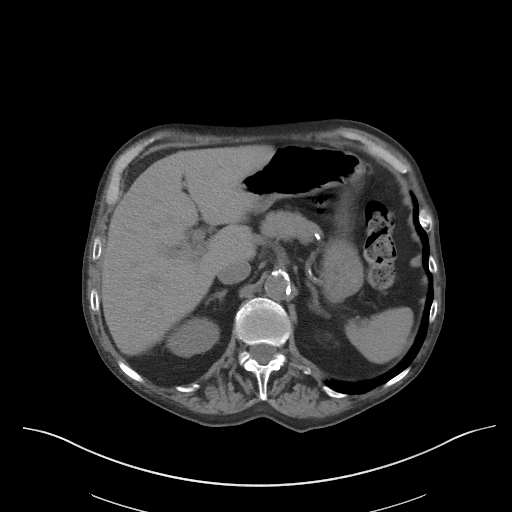
[im 80/87  soft-tissue]
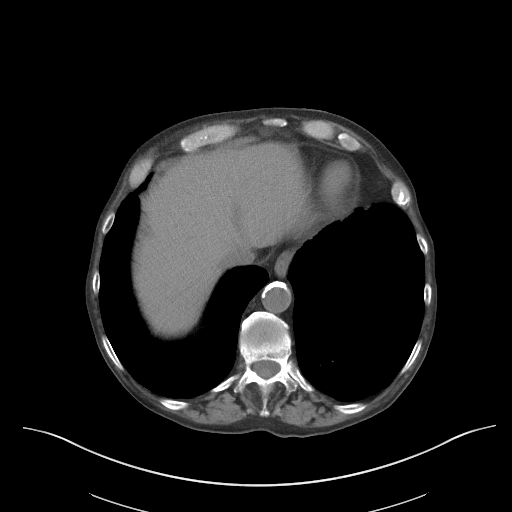

[Series 5: coronal · coronal · 0.68mm/px · 3 of 146 slices shown]
[im 49/146  soft-tissue]
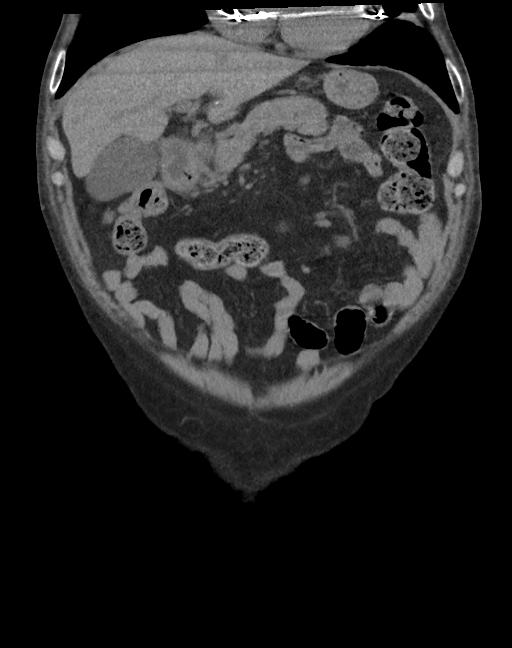
[im 65/146  soft-tissue]
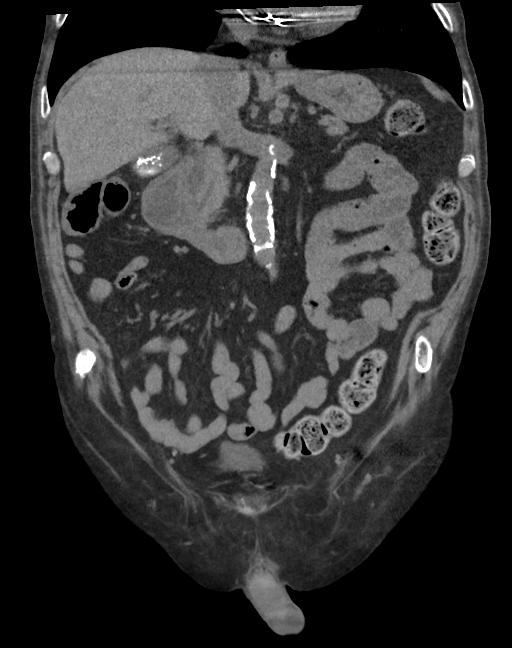
[im 81/146  soft-tissue]
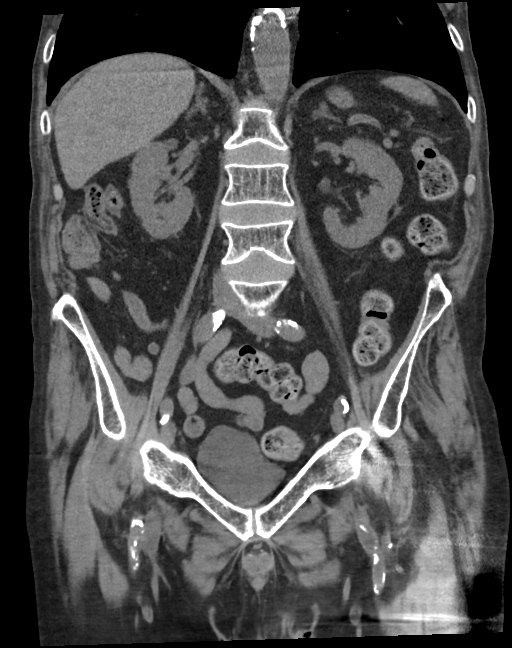

[15 of 46 positions shown; findings below may reference images not displayed]

FINDINGS: Lower chest: Partially visible cardiac pacemaker or AICD leads. No
pericardial effusion. Mild cardiomegaly. Negative lung bases. No
pleural effusion.

Hepatobiliary: Small layering cholelithiasis. No pericholecystic
inflammation. Negative noncontrast liver ; small chronic calcified
granulomas in the left lobe.

Pancreas: Within normal limits.

Spleen: Negative.

Adrenals/Urinary Tract: Normal adrenal glands.

Bilateral noncontrast kidneys appears stable and normal for age. No
hydroureter. Negative course of both ureters. Small right hemipelvis
phleboliths are stable.

Unremarkable urinary bladder.

Stomach/Bowel: Negative rectum. Redundant sigmoid colon with
retained stool but otherwise negative. Retained stool in the left
colon. Minimal diverticulae, no active inflammation. Continued
retained stool throughout the transverse colon which is otherwise
negative. Redundant hepatic flexure and right colon which is mostly
decompressed. Elongated but normal appendix.

Negative terminal ileum. No dilated small bowel. Negative stomach.
The second portion of the duodenum is fluid-filled, but not
definitely inflamed or abnormal (series 2, image 28). The remaining
duodenum is negative.

No abdominal free fluid or free air.

Vascular/Lymphatic: Extensive Aortoiliac calcified atherosclerosis.
Vascular patency is not evaluated in the absence of IV contrast.

No lymphadenopathy.

Reproductive: Stable small left fat containing inguinal hernia.

Other: No pelvic free fluid.

Musculoskeletal: Osteopenia. Diffuse lower thoracic and lumbar
spinal compression fractures sparing the L3 level. Prior proximal
left femur ORIF. Stable visualized osseous structures.
IMPRESSION: 1. No convincing acute or inflammatory process in the noncontrast
abdomen or pelvis.
2.  Aortic Atherosclerosis ([id number]).
3. Chronic cholelithiasis.
4. Chronic spinal compression fractures.

## 2019-07-03 IMAGING — DX DG ABDOMEN 1V
1 series · 1 of 1 positions shown · non-contrast
Comparison: Abdomen and pelvis CT dated 11/26/2016. Portable chest
radiographs obtained today

CLINICAL DATA: Orogastric tube placement.

EXAM:
ABDOMEN - 1 VIEW

[abdomen kub]
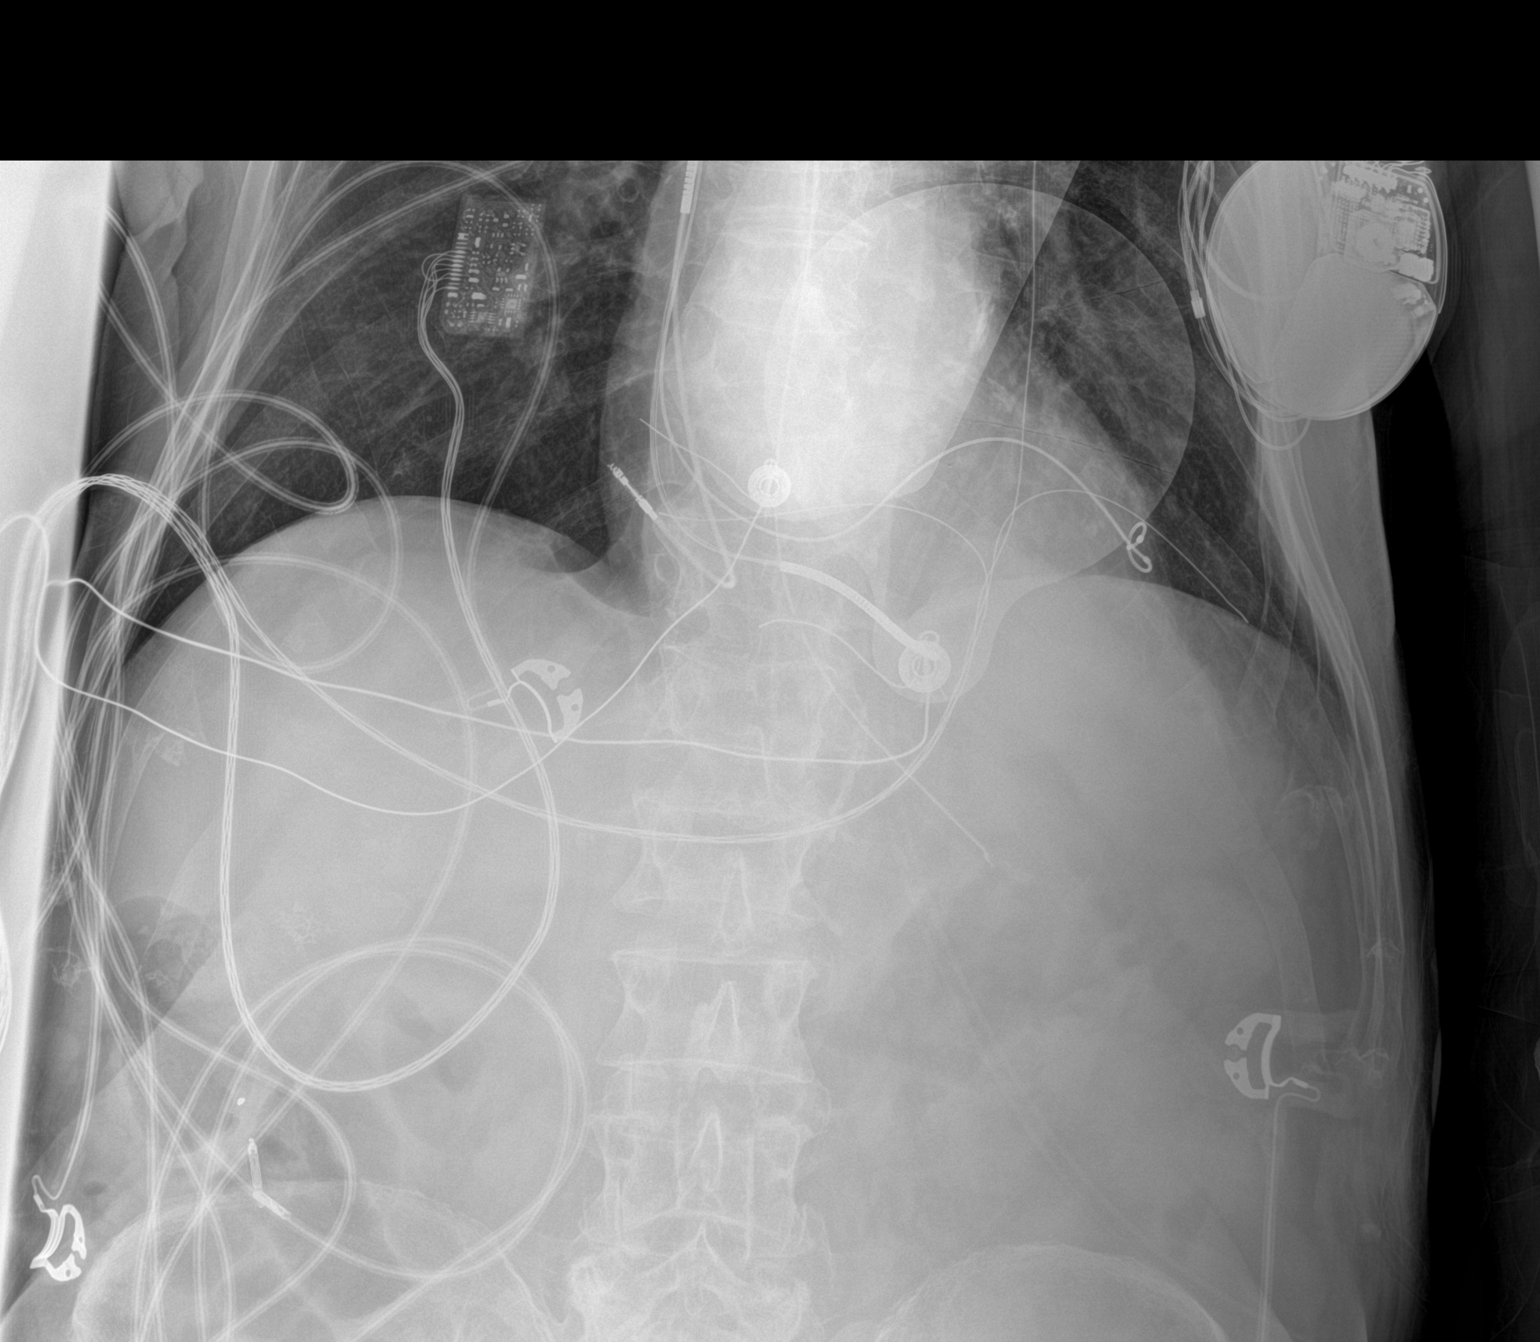

[1 of 1 positions shown; findings below may reference images not displayed]

FINDINGS: Orogastric tube tip in the mid stomach and side hole in the region
of the gastroesophageal junction. Paucity of upper abdominal
intestinal gas.
IMPRESSION: Orogastric tube tip in the mid stomach and side hole in the region
of the gastroesophageal junction.

## 2019-07-03 IMAGING — DX DG CHEST 1V PORT
1 series · 2 of 2 positions shown · non-contrast
Comparison: None.

CLINICAL DATA: Intubated.  Status post CPR.  Ex-smoker.

EXAM:
PORTABLE CHEST 1 VIEW

[Series 1: chest ap · 0.14mm/px · 2 of 2 slices shown]
[im 1/2]
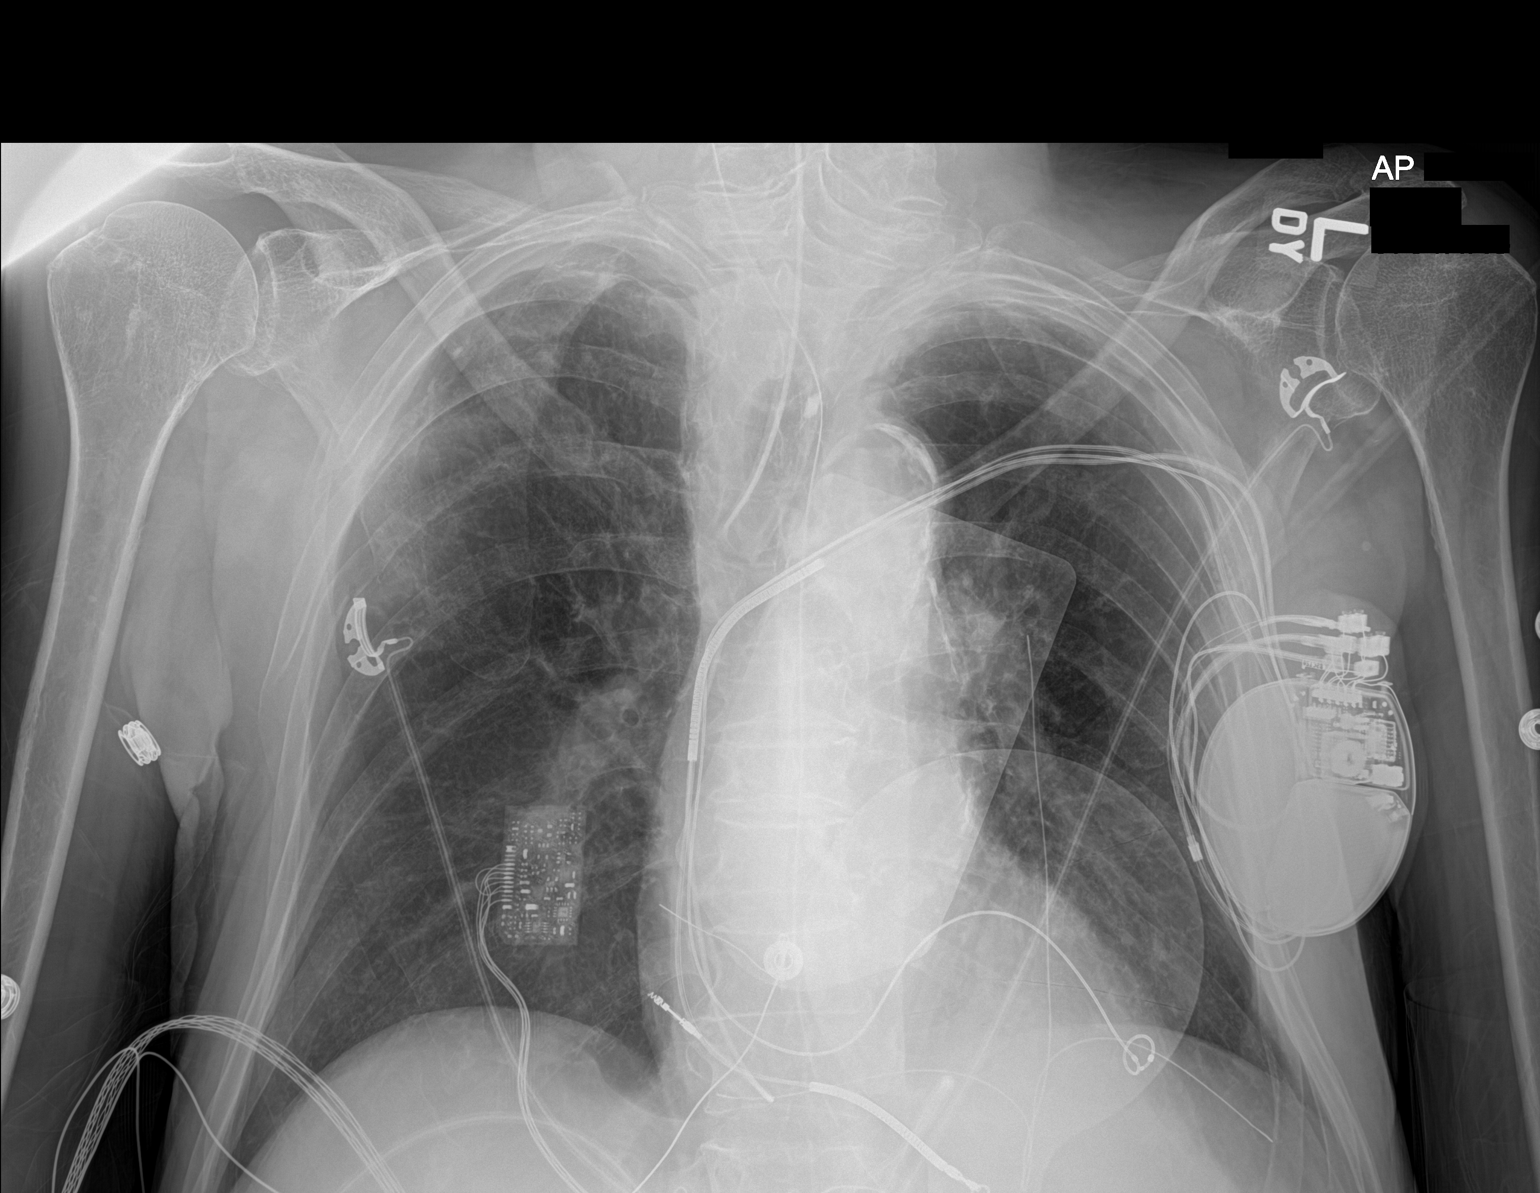
[im 2/2]
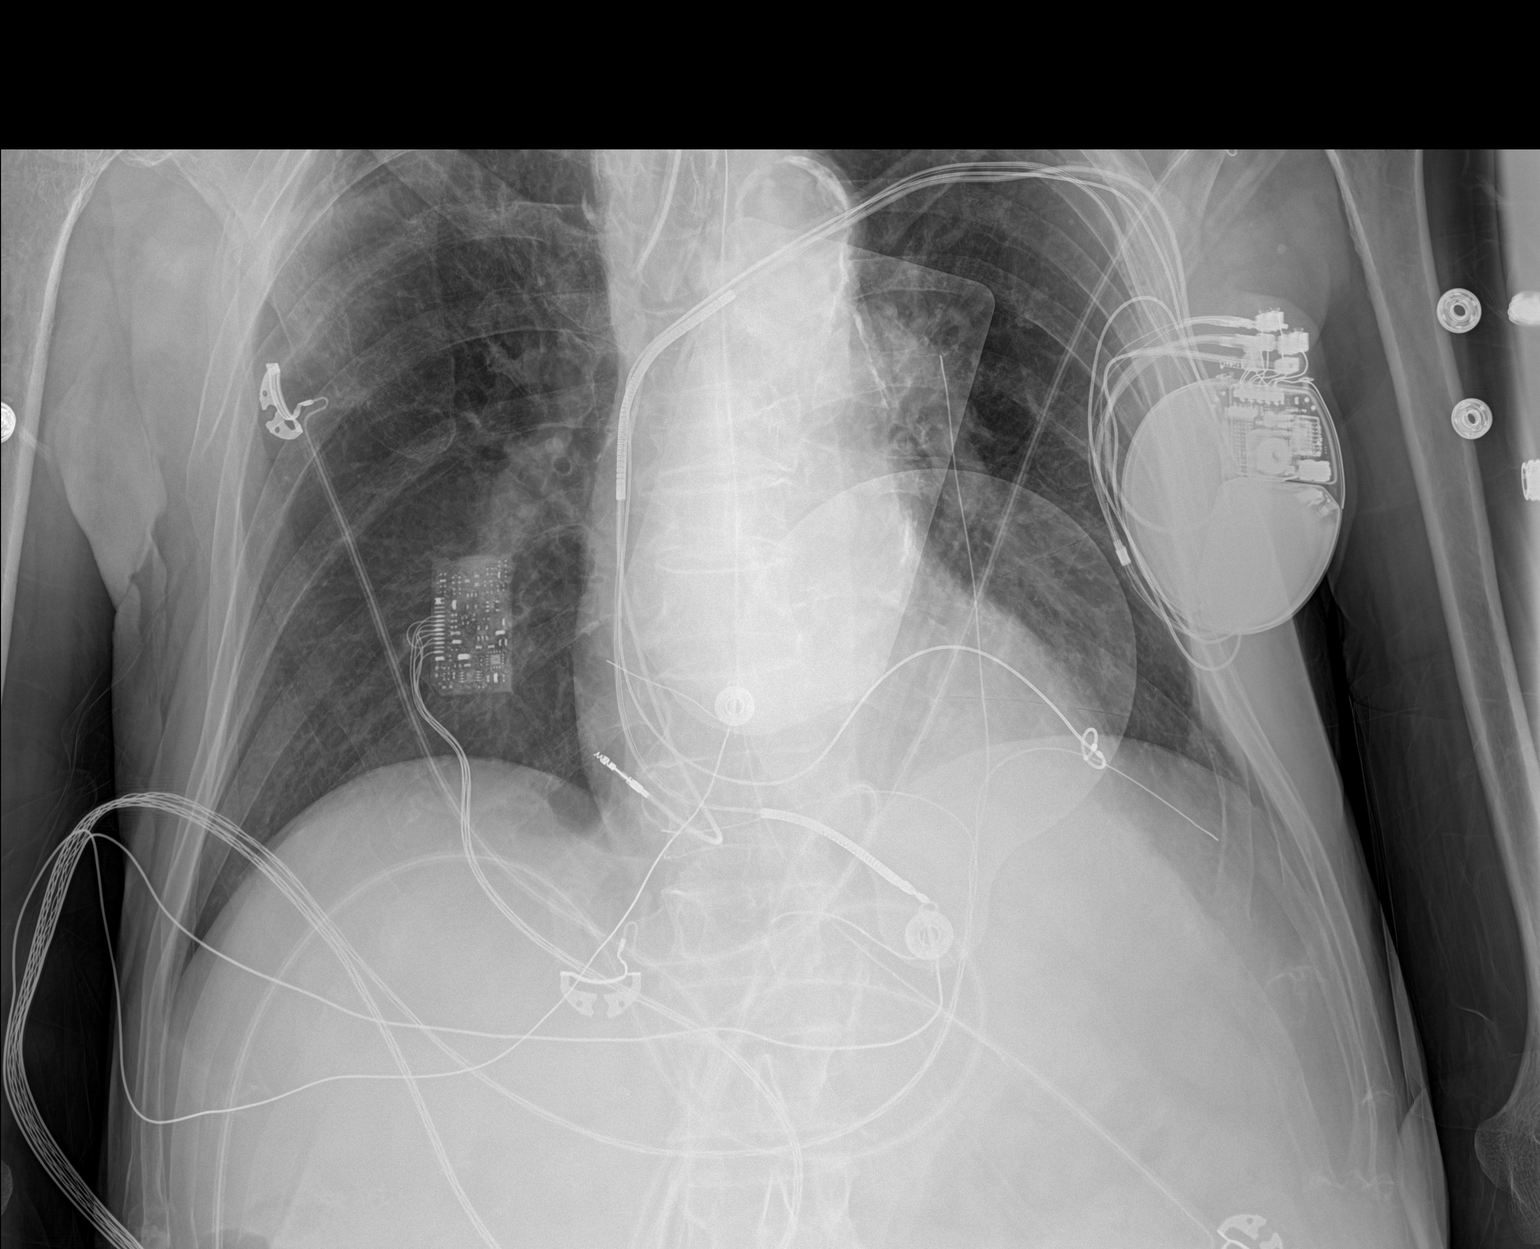

[2 of 2 positions shown; findings below may reference images not displayed]

FINDINGS: Endotracheal tube 1.1 cm above the carina. Nasogastric tube tip in
the mid stomach and side hole in the region of the gastroesophageal
junction. Left subclavian pacer and AICD leads.

Normal sized heart. Clear lungs with mild diffuse peribronchial
thickening and accentuation of the interstitial markings. Deformity
of the lateral aspects of several right mid to upper ribs, difficult
to visualize due to diffuse osteopenia and overlapping bones. No
pneumothorax.
IMPRESSION: 1. Probable acute fractures of several right mid upper ribs without
pneumothorax.
2. Mild changes of chronic bronchitis and COPD.
3. Endotracheal tube 1.1 cm above the carina. This could be
retracted 3 cm.
